# Patient Record
Sex: Female | Born: 1990 | Race: White | Hispanic: No | Marital: Married | Smoking: Never smoker
Health system: Southern US, Community
[De-identification: ages and names within clinical notes are randomized; demographics above are authoritative.]

## PROBLEM LIST (undated history)

## (undated) ENCOUNTER — Inpatient Hospital Stay: Payer: Self-pay

## (undated) DIAGNOSIS — Z23 Encounter for immunization: Secondary | ICD-10-CM

## (undated) DIAGNOSIS — K5900 Constipation, unspecified: Secondary | ICD-10-CM

## (undated) DIAGNOSIS — Z87442 Personal history of urinary calculi: Secondary | ICD-10-CM

## (undated) DIAGNOSIS — F988 Other specified behavioral and emotional disorders with onset usually occurring in childhood and adolescence: Secondary | ICD-10-CM

## (undated) DIAGNOSIS — F909 Attention-deficit hyperactivity disorder, unspecified type: Secondary | ICD-10-CM

## (undated) DIAGNOSIS — L709 Acne, unspecified: Secondary | ICD-10-CM

## (undated) DIAGNOSIS — F419 Anxiety disorder, unspecified: Secondary | ICD-10-CM

## (undated) DIAGNOSIS — O133 Gestational [pregnancy-induced] hypertension without significant proteinuria, third trimester: Secondary | ICD-10-CM

## (undated) HISTORY — PX: WISDOM TOOTH EXTRACTION: SHX21

## (undated) HISTORY — PX: EXCISION OF KELOID: SHX6267

## (undated) HISTORY — DX: Other specified behavioral and emotional disorders with onset usually occurring in childhood and adolescence: F98.8

## (undated) HISTORY — DX: Encounter for immunization: Z23

## (undated) HISTORY — DX: Acne, unspecified: L70.9

## (undated) HISTORY — DX: Attention-deficit hyperactivity disorder, unspecified type: F90.9

## (undated) HISTORY — DX: Constipation, unspecified: K59.00

## (undated) HISTORY — DX: Anxiety disorder, unspecified: F41.9

---
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
## 2012-05-31 ENCOUNTER — Other Ambulatory Visit: Payer: Self-pay | Admitting: Urgent Care

## 2012-05-31 LAB — COMPREHENSIVE METABOLIC PANEL
Albumin: 4.1 g/dL (ref 3.8–5.6)
Anion Gap: 4 — ABNORMAL LOW (ref 7–16)
Chloride: 104 mmol/L (ref 98–107)
EGFR (African American): >60
EGFR (Non-African Amer.): >60
Osmolality: 272 (ref 275–301)
Potassium: 4.1 mmol/L (ref 3.5–5.1)
SGOT(AST): 21 U/L (ref 0–26)
SGPT (ALT): 18 U/L (ref 12–78)
Sodium: 137 mmol/L (ref 136–145)

## 2012-05-31 LAB — CBC WITH DIFFERENTIAL/PLATELET
Basophil %: 0.6 %
Eosinophil #: 0 10*3/uL (ref 0.0–0.7)
Eosinophil %: 0.7 %
HCT: 39.4 % (ref 35.0–47.0)
Lymphocyte %: 42.7 %
MCH: 30.2 pg (ref 26.0–34.0)
MCV: 88 fL (ref 80–100)
Platelet: 264 10*3/uL (ref 150–440)
RDW: 12.5 % (ref 11.5–14.5)
WBC: 4.8 10*3/uL (ref 3.6–11.0)

## 2013-01-13 HISTORY — PX: TONSILLECTOMY: SHX5217

## 2013-03-04 ENCOUNTER — Ambulatory Visit: Payer: Self-pay | Admitting: Unknown Physician Specialty

## 2013-09-05 ENCOUNTER — Other Ambulatory Visit: Payer: Self-pay | Admitting: Urgent Care

## 2013-09-05 LAB — PREGNANCY, URINE: Pregnancy Test, Urine: NEGATIVE m[IU]/mL

## 2014-07-26 ENCOUNTER — Ambulatory Visit: Payer: Self-pay | Admitting: Family Medicine

## 2014-07-27 ENCOUNTER — Ambulatory Visit (INDEPENDENT_AMBULATORY_CARE_PROVIDER_SITE_OTHER): Payer: BLUE CROSS/BLUE SHIELD | Admitting: Family Medicine

## 2014-07-27 ENCOUNTER — Encounter: Payer: Self-pay | Admitting: Family Medicine

## 2014-07-27 VITALS — BP 112/62 | HR 71 | Temp 98.0°F | Resp 16 | Ht 61.0 in | Wt 119.2 lb

## 2014-07-27 DIAGNOSIS — R5383 Other fatigue: Secondary | ICD-10-CM | POA: Diagnosis not present

## 2014-07-27 DIAGNOSIS — F411 Generalized anxiety disorder: Secondary | ICD-10-CM

## 2014-07-27 DIAGNOSIS — Z13220 Encounter for screening for lipoid disorders: Secondary | ICD-10-CM | POA: Insufficient documentation

## 2014-07-27 DIAGNOSIS — K589 Irritable bowel syndrome without diarrhea: Secondary | ICD-10-CM | POA: Diagnosis not present

## 2014-07-27 DIAGNOSIS — Z113 Encounter for screening for infections with a predominantly sexual mode of transmission: Secondary | ICD-10-CM | POA: Diagnosis not present

## 2014-07-27 DIAGNOSIS — K581 Irritable bowel syndrome with constipation: Secondary | ICD-10-CM | POA: Insufficient documentation

## 2014-07-27 MED ORDER — AMITIZA 24 MCG PO CAPS: 24.0000 ug | ORAL_CAPSULE | Freq: Two times a day (BID) | ORAL | Status: DC

## 2014-07-27 NOTE — Progress Notes (Signed)
Name: Dorothy Townsend   MRN: [id number]    DOB: 02-23-1990   Date:07/27/2014       Progress Note  Subjective  Chief Complaint  Chief Complaint  Patient presents with  . Establish Care  . Labs Only    recommended by Dr. Sherlean Foot     HPI  Anxiety: Patient complains of anxiety disorder.  She has the following symptoms: difficulty concentrating, fatigue, feelings of losing control, irritable, palpitations, psychomotor agitation, racing thoughts. Onset of symptoms was approximately several years ago, stable since that time. She denies current suicidal and homicidal ideation. Family history significant for anxiety and depression.Possible organic causes contributing are: none. Risk factors: previous episode of depression Previous treatment includes Prozac and therapy with Dr. Sherlean Foot, psychiatrist in [place]. 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  She complains of the following side effects from the treatment previously: none. She was also taking Adderall for ADD which she does not use at this time. Overall she feels some component of fatigue but is functional and doing well. Wants to get routine blood work to assess her general health and get PAP smear during her CPE.  Constipation: Patient complains of constipation.  Stool pattern has been 3 formed stool(s) per week. Onset was several years ago Defecation has been easy. Co-Morbid conditions:irritable bowel syndrome. Symptoms have been waxing and waning but better overall. Current Health Habits: Eating fiber? yes, amt not measured but generally healthy balanced food choices. Exercise? yes - regular cardio and weights. Water intake? yes, amt not measured but regular intake. Current OTC/RX therapy has been Amitiza which has been somewhat effective.      There are no active problems to display for this patient.   History   Substance Use Topics  . Smoking status: Never Smoker   . Smokeless tobacco: Not on file  . Alcohol Use: 0.0 oz/week    0 Standard drinks or equivalent per week     Comment: seldom     Current outpatient prescriptions:  .  AMITIZA 24 MCG capsule, TK 1 C PO BID PRF CONSTIPATION, Disp: , Rfl: 2  Past Surgical History  Procedure Laterality Date  . Wisdom tooth extraction    . Tonsillectomy    . Excision of keloid      Family History  Problem Relation Age of Onset  . Diabetes Maternal Grandfather     Allergies  Allergen Reactions  . Fluvoxamine Anxiety     Review of Systems  CONSTITUTIONAL: No significant weight changes, fever, chills, weakness.  HEENT:  - Eyes: No visual changes.  - Ears: No auditory changes. No pain.  - Nose: No sneezing, congestion, runny nose. - Throat: No sore throat. No changes in swallowing. SKIN: No rash or itching.  CARDIOVASCULAR: No chest pain, chest pressure or chest discomfort. No palpitations or edema.  RESPIRATORY: No shortness of breath, cough or sputum.  GASTROINTESTINAL: Chronic constipation issues. No anorexia, nausea, vomiting. No abdominal pain or blood.  GENITOURINARY: No dysuria. No frequency. No discharge.  NEUROLOGICAL: No headache, dizziness, syncope, paralysis, ataxia, numbness or tingling in the extremities. No memory changes. No change in bowel or bladder control.  MUSCULOSKELETAL: No joint pain. No muscle pain. HEMATOLOGIC: No anemia, bleeding or bruising.  LYMPHATICS: No enlarged lymph nodes.  PSYCHIATRIC: No change in mood. No change in sleep pattern.  ENDOCRINOLOGIC: No reports of sweating, cold or heat intolerance.  No polyuria or polydipsia.     Objective  BP 112/62 mmHg  Pulse 71  Temp(Src) 98 F (36.7 C) (Oral)  Resp 16  Ht 5\' 1"  (1.549 m)  Wt 119 lb 3.2 oz (54.069 kg)  BMI 22.53 kg/m2  SpO2 98%  LMP 07/10/2014 (Approximate) Body mass index is 22.53 kg/(m^2).  Physical Exam  Constitutional: Patient  appears well-developed and well-nourished. In no distress.  HEENT:  - Head: Normocephalic and atraumatic.  - Ears: Bilateral TMs gray, no erythema or effusion - Nose: Nasal mucosa moist - Mouth/Throat: Oropharynx is clear and moist. No tonsillar hypertrophy or erythema. No post nasal drainage.  - Eyes: Conjunctivae clear, EOM movements normal. PERRLA. No scleral icterus.  Neck: Normal range of motion. Neck supple. No JVD present. No thyromegaly present.  Cardiovascular: Normal rate, regular rhythm and normal heart sounds.  No murmur heard.  Pulmonary/Chest: Effort normal and breath sounds normal. No respiratory distress. Musculoskeletal: Normal range of motion bilateral UE and LE, no joint effusions. Peripheral vascular: Bilateral LE no edema. Neurological: CN II-XII grossly intact with no focal deficits. Alert and oriented to person, place, and time. Coordination, balance, strength, speech and gait are normal.  Skin: Skin is warm and dry. No rash noted. No erythema.  Psychiatric: Patient has a normal mood and affect. Behavior is normal in office today. Judgment and thought content normal in office today.   Assessment & Plan  1. GAD (generalized anxiety disorder) Well controled, discussed benefits of mindfulness, regular exercise, meditation, yoga.  2. Irritable bowel syndrome with constipation Will get blood work, continue Sales executiveAmitiza.  - CBC with Differential/Platelet - Comprehensive metabolic panel - Lipid panel - TSH - AMITIZA 24 MCG capsule; Take 1 capsule (24 mcg total) by mouth 2 (two) times daily with a meal.  Dispense: 60 capsule; Refill: 5  3. Screening cholesterol level   4. Other fatigue Will get blood work. Likely related to mood disorder.  - CBC with Differential/Platelet - Comprehensive metabolic panel - Lipid panel - TSH  5. Screen for STD (sexually transmitted disease) Age appropriate screening.  - HIV antibody

## 2014-07-28 LAB — CBC WITH DIFFERENTIAL/PLATELET
BASOS ABS: 0 10*3/uL (ref 0.0–0.2)
BASOS: 1 %
EOS (ABSOLUTE): 0.1 10*3/uL (ref 0.0–0.4)
Eos: 1 %
HEMOGLOBIN: 13.3 g/dL (ref 11.1–15.9)
Hematocrit: 39.5 % (ref 34.0–46.6)
Immature Grans (Abs): 0 10*3/uL (ref 0.0–0.1)
Immature Granulocytes: 0 %
LYMPHS ABS: 2.1 10*3/uL (ref 0.7–3.1)
Lymphs: 49 %
MCH: 30 pg (ref 26.6–33.0)
MCHC: 33.7 g/dL (ref 31.5–35.7)
MCV: 89 fL (ref 79–97)
MONOCYTES: 5 %
Monocytes Absolute: 0.2 10*3/uL (ref 0.1–0.9)
Neutrophils Absolute: 1.9 10*3/uL (ref 1.4–7.0)
Neutrophils: 44 %
Platelets: 276 10*3/uL (ref 150–379)
RBC: 4.44 x10E6/uL (ref 3.77–5.28)
RDW: 13 % (ref 12.3–15.4)
WBC: 4.4 10*3/uL (ref 3.4–10.8)

## 2014-07-28 LAB — COMPREHENSIVE METABOLIC PANEL
ALT: 12 IU/L (ref 0–32)
AST: 20 IU/L (ref 0–40)
Albumin/Globulin Ratio: 3 — ABNORMAL HIGH (ref 1.1–2.5)
Albumin: 4.8 g/dL (ref 3.5–5.5)
Alkaline Phosphatase: 84 IU/L (ref 39–117)
BUN/Creatinine Ratio: 11 (ref 8–20)
BUN: 7 mg/dL (ref 6–20)
Bilirubin Total: 0.4 mg/dL (ref 0.0–1.2)
CALCIUM: 9.1 mg/dL (ref 8.7–10.2)
CO2: 23 mmol/L (ref 18–29)
CREATININE: 0.63 mg/dL (ref 0.57–1.00)
Chloride: 100 mmol/L (ref 97–108)
GFR calc non Af Amer: 129 mL/min/{1.73_m2} (ref >59)
GFR, EST AFRICAN AMERICAN: 148 mL/min/{1.73_m2} (ref >59)
Globulin, Total: 1.6 g/dL (ref 1.5–4.5)
Glucose: 74 mg/dL (ref 65–99)
Potassium: 4.2 mmol/L (ref 3.5–5.2)
Sodium: 140 mmol/L (ref 134–144)
TOTAL PROTEIN: 6.4 g/dL (ref 6.0–8.5)

## 2014-07-28 LAB — TSH: TSH: 1.83 u[IU]/mL (ref 0.450–4.500)

## 2014-07-28 LAB — LIPID PANEL
CHOL/HDL RATIO: 2.2 ratio (ref 0.0–4.4)
CHOLESTEROL TOTAL: 156 mg/dL (ref 100–199)
HDL: 72 mg/dL (ref >39)
LDL Calculated: 78 mg/dL (ref 0–99)
TRIGLYCERIDES: 32 mg/dL (ref 0–149)
VLDL CHOLESTEROL CAL: 6 mg/dL (ref 5–40)

## 2014-07-28 LAB — HIV ANTIBODY (ROUTINE TESTING W REFLEX): HIV SCREEN 4TH GENERATION: NONREACTIVE

## 2015-11-20 LAB — HM PAP SMEAR: HM PAP: NEGATIVE

## 2016-07-07 ENCOUNTER — Ambulatory Visit: Payer: Self-pay | Admitting: Obstetrics and Gynecology

## 2016-07-23 ENCOUNTER — Ambulatory Visit (INDEPENDENT_AMBULATORY_CARE_PROVIDER_SITE_OTHER): Payer: BLUE CROSS/BLUE SHIELD | Admitting: Obstetrics and Gynecology

## 2016-07-23 ENCOUNTER — Encounter: Payer: Self-pay | Admitting: Obstetrics and Gynecology

## 2016-07-23 VITALS — BP 114/78 | HR 80 | Ht 61.0 in | Wt 120.0 lb

## 2016-07-23 DIAGNOSIS — R102 Pelvic and perineal pain: Secondary | ICD-10-CM

## 2016-07-23 DIAGNOSIS — Z3169 Encounter for other general counseling and advice on procreation: Secondary | ICD-10-CM

## 2016-07-23 NOTE — Progress Notes (Signed)
Chief Complaint  Patient presents with  . preconception  . Pelvic Pain    Lower abdominal pain x 2 weeks/bloated/painful intercourse    HPI:      [name] is a 26 y.o. G0P0000 who LMP was Patient's last menstrual period was 07/05/2016., presents today for an episode of severe pelvic pain about 2 wks ago. Sx started after sex and caused nausea/vomiting/sweating/chills/abd swelling. Pt denies any bleeding. Sx improved after a few hrs but she was sore, it hurt to walk and she didn't feel well for a couple days. Sx resolved fully and pt has had sex again without any sx. This has never happened before. She denies any urin sx, vag sx. She has a hx of constipation but those sx weren't any worse before onset of pain. She has a hx of kidney stones. No hx of ovar cysts. She is trying to conceive and taking PNVs. She is married/no new partners.     Patient Active Problem List   Diagnosis Date Noted  . GAD (generalized anxiety disorder) 07/27/2014  . Irritable bowel syndrome with constipation 07/27/2014  . Screening cholesterol level 07/27/2014  . Other fatigue 07/27/2014    Family History  Problem Relation Age of Onset  . Diabetes Maternal Grandfather   . Cancer Other        UNKNOWN AGE/TYPE-THINKS THROAT    Social History   Social History  . Marital status: Single    Spouse name: N/A  . Number of children: 0  . Years of education: 14   Occupational History  . Stylist     [employer]   Social History Main Topics  . Smoking status: Never Smoker  . Smokeless tobacco: Never Used  . Alcohol use 0.0 oz/week     Comment: seldom  . Drug use: No  . Sexual activity: Yes    Partners: Male    Birth control/ protection: None   Other Topics Concern  . Not on file   Social History Narrative  . No narrative on file    No current outpatient prescriptions on file.  Review of Systems  Constitutional: Negative for fever.  Gastrointestinal: Positive for nausea and  vomiting. Negative for blood in stool, constipation and diarrhea.  Genitourinary: Positive for pelvic pain and vaginal discharge. Negative for dyspareunia, dysuria, flank pain, frequency, hematuria, urgency, vaginal bleeding and vaginal pain.  Musculoskeletal: Negative for back pain.  Skin: Negative for rash.     OBJECTIVE:   Vitals:  BP 114/78 (BP Location: Left Arm, Patient Position: Sitting, Cuff Size: Normal)   Pulse 80   Ht 5\' 1"  (1.549 m)   Wt 120 lb (54.4 kg)   LMP 07/05/2016   BMI 22.67 kg/m   Physical Exam  Constitutional: She is oriented to person, place, and time and well-developed, well-nourished, and in no distress. Vital signs are normal.  Genitourinary: Vagina normal, uterus normal, cervix normal, right adnexa normal, left adnexa normal and vulva normal. Uterus is not enlarged. Cervix exhibits no motion tenderness and no tenderness. Right adnexum displays no mass and no tenderness. Left adnexum displays no mass and no tenderness. Vulva exhibits no erythema, no exudate, no lesion, no rash and no tenderness. Vagina exhibits no lesion.  Neurological: She is oriented to person, place, and time.  Vitals reviewed.    Assessment/Plan: Pelvic pain - Resolved. No sx today. Neg exam. Question etiology. Discussed u/s now or if sx recur. Pt ok to wait. F/u prn.   Pre-conception counseling -  Cont PNVs. F/u prn.    Return if symptoms worsen or fail to improve.  Katheleen Stella B. Ivyrose Hashman, PA-C 07/23/2016 3:22 PM

## 2016-09-22 ENCOUNTER — Encounter: Payer: Self-pay | Admitting: Obstetrics and Gynecology

## 2016-09-22 ENCOUNTER — Ambulatory Visit (INDEPENDENT_AMBULATORY_CARE_PROVIDER_SITE_OTHER): Payer: BLUE CROSS/BLUE SHIELD | Admitting: Obstetrics and Gynecology

## 2016-09-22 VITALS — BP 108/56 | Wt 123.0 lb

## 2016-09-22 DIAGNOSIS — Z3A01 Less than 8 weeks gestation of pregnancy: Secondary | ICD-10-CM

## 2016-09-22 DIAGNOSIS — Z3491 Encounter for supervision of normal pregnancy, unspecified, first trimester: Secondary | ICD-10-CM

## 2016-09-22 DIAGNOSIS — Z3490 Encounter for supervision of normal pregnancy, unspecified, unspecified trimester: Secondary | ICD-10-CM | POA: Insufficient documentation

## 2016-09-22 DIAGNOSIS — N912 Amenorrhea, unspecified: Secondary | ICD-10-CM | POA: Diagnosis not present

## 2016-09-22 LAB — POCT URINE PREGNANCY: Preg Test, Ur: POSITIVE — AB

## 2016-09-22 NOTE — Progress Notes (Signed)
New Obstetric Patient H&P   Chief Complaint: "Desires prenatal care"   History of Present Illness: Patient is a 26 y.o. G1P0000 Not Hispanic or Latino female, LMP 08/03/2016 presents with amenorrhea and positive home pregnancy test. Based on her  LMP, her EDD is Estimated Date of Delivery: 05/10/17 and her EGA is 8463w1d. Cycles are 5. days, regular, and occur approximately every : 28 days. Her last pap smear was about 9 months ago and was no abnormalities.    She had a urine pregnancy test which was positive 3 week(s)  ago. Her last menstrual period was normal and lasted for  5 day(s). Since her LMP she claims she has experienced nausea. She denies vaginal bleeding. Her past medical history is noncontributory. Her prior pregnancies are notable for none  Since her LMP, she admits to the use of tobacco products  no She claims she has gained   zero pounds since the start of her pregnancy.  There are cats in the home in the home  no I  She admits close contact with children on a regular basis  no  She has had chicken pox in the past yes She has had Tuberculosis exposures, symptoms, or previously tested positive for TB   no Current or past history of domestic violence. no  Genetic Screening/Teratology Counseling: (Includes patient, baby's father, or anyone in either family with:)   1. [facility]/= 6735 [facility]  no 2. Thalassemia ([place], [place], Mediterranean, or Asian background): MCV<80  no 3. Neural tube defect (meningomyelocele, spina bifida, anencephaly)  no 4. Congenital heart defect  no  5. Down syndrome  no 6. Tay-Sachs (Jewish, [place])  no 7. Canavan's Disease  no 8. Sickle cell disease or trait (African)  no  9. Hemophilia or other blood disorders  no  10. Muscular dystrophy  no  11. Cystic fibrosis  no  12. Huntington's Chorea  no  13. Mental retardation/autism  no 14. Other inherited genetic or chromosomal disorder  no 15. Maternal metabolic disorder (DM, PKU, etc)   no 16. Patient or FOB with a child with a birth defect not listed above no  16a. Patient or FOB with a birth defect themselves no 17. Recurrent pregnancy loss, or stillbirth  no  18. Any medications since LMP other than prenatal vitamins (include vitamins, supplements, OTC meds, drugs, alcohol)  no 19. Any other genetic/environmental exposure to discuss  no  Infection History:   1. Lives with someone with TB or TB exposed  no  2. Patient or partner has history of genital herpes  no 3. Rash or viral illness since LMP  no 4. History of STI (GC, CT, HPV, syphilis, HIV)  no 5. History of recent travel :  no  Other pertinent information:  no   Review of Systems:10 point review of systems negative unless otherwise noted in HPI  Past Medical History:  Diagnosis Date  . Acne   . ADD (attention deficit disorder)   . ADHD (attention deficit hyperactivity disorder)    history of  . Anxiety    history of  . Constipation   . Immunization, viral disease    GARDASIL COMPLETED    Past Surgical History:  Procedure Laterality Date  . EXCISION OF KELOID    . EXTERNAL EAR SURGERY Bilateral 2012   NODULES REMOVED   . TONSILLECTOMY  2015  . WISDOM TOOTH EXTRACTION      Gynecologic History: Patient's last menstrual period was 08/03/2016 (exact date).  Obstetric History: G1P0000  Family History  Problem Relation Age of Onset  . Diabetes Maternal Grandfather   . Cancer Other        UNKNOWN AGE/TYPE-THINKS THROAT    Social History   Social History  . Marital status: Single    Spouse name: N/A  . Number of children: 0  . Years of education: 14   Occupational History  . Stylist     [employer]   Social History Main Topics  . Smoking status: Never Smoker  . Smokeless tobacco: Never Used  . Alcohol use 0.0 oz/week     Comment: seldom  . Drug use: No  . Sexual activity: Yes    Partners: Male    Birth control/ protection: None   Other Topics Concern  . Not on file    Social History Narrative  . No narrative on file    Allergies  Allergen Reactions  . Fluvoxamine Anxiety    Medications   PNV    Physical Exam BP (!) 108/56   Wt 123 lb (55.8 kg)   LMP 08/03/2016 (Exact Date)   BMI 23.24 kg/m   Physical Exam  Constitutional: She is oriented to person, place, and time. She appears well-developed and well-nourished. No distress.  HENT:  Head: Normocephalic and atraumatic.  Eyes: Conjunctivae are normal.  Neck: Normal range of motion. Neck supple. No thyromegaly present.  Cardiovascular: Normal rate, regular rhythm and normal heart sounds.  Exam reveals no gallop and no friction rub.   No murmur heard. Pulmonary/Chest: Effort normal and breath sounds normal. She has no wheezes.  Abdominal: Soft. She exhibits no distension and no mass. There is no tenderness. There is no rebound and no guarding. No hernia. Hernia confirmed negative in the right inguinal area and confirmed negative in the left inguinal area.  Genitourinary: Vagina normal. Pelvic exam was performed with patient supine. There is no rash, tenderness or lesion on the right labia. There is no rash, tenderness or lesion on the left labia. Uterus is enlarged (8 week size). Uterus is not deviated and not tender. Cervix exhibits no motion tenderness and no discharge. Right adnexum displays no mass, no tenderness and no fullness. Left adnexum displays no mass, no tenderness and no fullness. No tenderness or bleeding in the vagina. No signs of injury around the vagina.  Musculoskeletal: Normal range of motion.  Lymphadenopathy:    She has no cervical adenopathy.       Right: No inguinal adenopathy present.       Left: No inguinal adenopathy present.  Neurological: She is alert and oriented to person, place, and time.  Skin: Skin is warm and dry. No rash noted.  Psychiatric: She has a normal mood and affect. Her behavior is normal. Judgment normal.    Female Chaperone present during breast  and/or pelvic exam.  Assessment: 26 y.o. G1P0000 at [redacted]w[redacted]d presenting to initiate prenatal care  Plan: 1) Avoid alcoholic beverages. 2) Patient encouraged not to smoke.  3) Discontinue the use of all non-medicinal drugs and chemicals.  4) Take prenatal vitamins daily.  5) Nutrition, food safety (fish, cheese advisories, and high nitrite foods) and exercise discussed. 6) Hospital and practice style discussed with cross coverage system.  7) Genetic Screening, such as with 1st Trimester Screening, cell free fetal DNA, AFP testing, and Ultrasound, as well as with amniocentesis and CVS as appropriate, is discussed with patient. At the conclusion of today's visit patient undecided genetic testing 8) Patient is asked about  travel to areas at risk for the Zika virus, and counseled to avoid travel and exposure to mosquitoes or sexual partners who may have themselves been exposed to the virus. Testing is discussed, and will be ordered as appropriate.  9) She does have a history of constipation, which worries her.  Discussed adding a stool softener to her PNV.  Samples given.   [name], MD 09/22/2016 1:45 PM

## 2016-09-24 LAB — GC/CHLAMYDIA PROBE AMP
Chlamydia trachomatis, NAA: NEGATIVE
Neisseria gonorrhoeae by PCR: NEGATIVE

## 2016-09-24 LAB — URINE CULTURE: ORGANISM ID, BACTERIA: NO GROWTH

## 2016-09-29 ENCOUNTER — Ambulatory Visit (INDEPENDENT_AMBULATORY_CARE_PROVIDER_SITE_OTHER): Payer: BLUE CROSS/BLUE SHIELD | Admitting: Obstetrics and Gynecology

## 2016-09-29 ENCOUNTER — Encounter: Payer: BLUE CROSS/BLUE SHIELD | Admitting: Obstetrics and Gynecology

## 2016-09-29 ENCOUNTER — Other Ambulatory Visit: Payer: BLUE CROSS/BLUE SHIELD

## 2016-09-29 ENCOUNTER — Ambulatory Visit (INDEPENDENT_AMBULATORY_CARE_PROVIDER_SITE_OTHER): Payer: BLUE CROSS/BLUE SHIELD

## 2016-09-29 VITALS — BP 110/74 | Wt 125.0 lb

## 2016-09-29 DIAGNOSIS — Z3491 Encounter for supervision of normal pregnancy, unspecified, first trimester: Secondary | ICD-10-CM

## 2016-09-29 DIAGNOSIS — F411 Generalized anxiety disorder: Secondary | ICD-10-CM

## 2016-09-29 DIAGNOSIS — Z362 Encounter for other antenatal screening follow-up: Secondary | ICD-10-CM | POA: Diagnosis not present

## 2016-09-29 DIAGNOSIS — Z3A08 8 weeks gestation of pregnancy: Secondary | ICD-10-CM

## 2016-09-29 NOTE — Progress Notes (Signed)
Routine Prenatal Care Visit  Subjective  Dorothy Townsend is a 26 y.o. G1P0000 at [redacted]w[redacted]d being seen today for ongoing prenatal care.  She is currently monitored for the following issues for this low-risk pregnancy and has GAD (generalized anxiety disorder); Irritable bowel syndrome with constipation; Screening cholesterol level; Other fatigue; and Supervision of low-risk pregnancy on her problem list.  ----------------------------------------------------------------------------------- Patient reports no complaints.    . Vag. Bleeding: None.   . Denies leaking of fluid.  U/S today verified EDD.  Nausea improved with Bonjesta.  Desires 1st trimester screen.  ----------------------------------------------------------------------------------- The following portions of the patient's history were reviewed and updated as appropriate: allergies, current medications, past family history, past medical history, past social history, past surgical history and problem list. Problem list updated.  Objective  Blood pressure 110/74, weight 125 lb (56.7 kg), last menstrual period 08/03/2016. Pregravid weight 118 lb (53.5 kg) Total Weight Gain 7 lb (3.175 kg) Urinalysis: Urine Protein: Negative Urine Glucose: Negative  Fetal Status: Fetal Heart Rate (bpm): 160         General:  Alert, oriented and cooperative. Patient is in no acute distress.  Skin: Skin is warm and dry. No rash noted.   Cardiovascular: Normal heart rate noted  Respiratory: Normal respiratory effort, no problems with respiration noted  Abdomen: Soft, gravid, appropriate for gestational age. Pain/Pressure: Absent     Pelvic:  Cervical exam deferred        Extremities: Normal range of motion.     Mental Status: Normal mood and affect. Normal behavior. Normal judgment and thought content.   Assessment   26 y.o. G1P0000 at [redacted]w[redacted]d by  05/10/2017, by Last Menstrual Period presenting for routine prenatal visit  Plan   pregnancy #1 Problems  (from 08/03/16 to present)    Problem Noted Resolved   Supervision of low-risk pregnancy 09/22/2016 by Conard Novak, MD No   Overview Addendum 09/29/2016  4:08 PM by Conard Novak, MD    [facility] Prenatal Labs  Dating L=8 Blood type:     Genetic Screen 1 Screen:  desires    AFP:   Antibody:   Anatomic [place]  Rubella:   Varicella:    GTT Early:               Third trimester:  RPR:     Rhogam  28 weeks,  pp HBsAg:     TDaP vaccine                        Flu Shot:  offered HIV:     Baby Food                                GBS:   Contraception  Pap:  CBB     CS/VBAC    Support Person             GAD (generalized anxiety disorder) 07/27/2014 by Edwena Felty, MD No   Overview Signed 07/27/2014  9:35 AM by Edwena Felty, MD    Followed by Dr. Sherlean Foot , [facility]. Was previously on Fluoxetine and Adderall but has not needed it recently.        Please refer to After Visit Summary for other counseling recommendations.   Return in about 4 weeks (around 10/27/2016) for schedule u/s for NT and ROB after.  Dorothy Mohair, MD  09/29/2016  4:09 PM

## 2016-09-30 LAB — RPR+RH+ABO+RUB AB+AB SCR+CB...
Antibody Screen: NEGATIVE
HIV Screen 4th Generation wRfx: NONREACTIVE
Hematocrit: 38 % (ref 34.0–46.6)
Hemoglobin: 12.6 g/dL (ref 11.1–15.9)
Hepatitis B Surface Ag: NEGATIVE
MCH: 29.9 pg (ref 26.6–33.0)
MCHC: 33.2 g/dL (ref 31.5–35.7)
MCV: 90 fL (ref 79–97)
Platelets: 260 10*3/uL (ref 150–379)
RBC: 4.22 x10E6/uL (ref 3.77–5.28)
RDW: 12.4 % (ref 12.3–15.4)
RPR Ser Ql: NONREACTIVE
Rh Factor: POSITIVE
Rubella Antibodies, IGG: 10.3 index (ref >0.99)
Varicella zoster IgG: 524 index (ref >165)
WBC: 6.8 10*3/uL (ref 3.4–10.8)

## 2016-09-30 LAB — CYSTIC FIBROSIS MUTATION 97: Interpretation: NOT DETECTED

## 2016-10-30 ENCOUNTER — Ambulatory Visit (INDEPENDENT_AMBULATORY_CARE_PROVIDER_SITE_OTHER): Payer: BLUE CROSS/BLUE SHIELD | Admitting: Obstetrics and Gynecology

## 2016-10-30 ENCOUNTER — Ambulatory Visit (INDEPENDENT_AMBULATORY_CARE_PROVIDER_SITE_OTHER): Payer: BLUE CROSS/BLUE SHIELD

## 2016-10-30 VITALS — BP 114/76 | Wt 127.0 lb

## 2016-10-30 DIAGNOSIS — Z3491 Encounter for supervision of normal pregnancy, unspecified, first trimester: Secondary | ICD-10-CM | POA: Diagnosis not present

## 2016-10-30 DIAGNOSIS — Z362 Encounter for other antenatal screening follow-up: Secondary | ICD-10-CM

## 2016-10-30 DIAGNOSIS — F411 Generalized anxiety disorder: Secondary | ICD-10-CM

## 2016-10-30 DIAGNOSIS — Z3A12 12 weeks gestation of pregnancy: Secondary | ICD-10-CM

## 2016-10-30 NOTE — Patient Instructions (Signed)
First Trimester of Pregnancy The first trimester of pregnancy is from week 1 until the end of week 13 (months 1 through 3). A week after a sperm fertilizes an egg, the egg will implant on the wall of the uterus. This embryo will begin to develop into a baby. Genes from you and your partner will form the baby. The female genes will determine whether the baby will be a boy or a girl. At 6-8 weeks, the eyes and face will be formed, and the heartbeat can be seen on ultrasound. At the end of 12 weeks, all the baby's organs will be formed. Now that you are pregnant, you will want to do everything you can to have a healthy baby. Two of the most important things are to get good prenatal care and to follow your health care provider's instructions. Prenatal care is all the medical care you receive before the baby's birth. This care will help prevent, find, and treat any problems during the pregnancy and childbirth. Body changes during your first trimester Your body goes through many changes during pregnancy. The changes vary from woman to woman.  You may gain or lose a couple of pounds at first.  You may feel sick to your stomach (nauseous) and you may throw up (vomit). If the vomiting is uncontrollable, call your health care provider.  You may tire easily.  You may develop headaches that can be relieved by medicines. All medicines should be approved by your health care provider.  You may urinate more often. Painful urination may mean you have a bladder infection.  You may develop heartburn as a result of your pregnancy.  You may develop constipation because certain hormones are causing the muscles that push stool through your intestines to slow down.  You may develop hemorrhoids or swollen veins (varicose veins).  Your breasts may begin to grow larger and become tender. Your nipples may stick out more, and the tissue that surrounds them (areola) may become darker.  Your gums may bleed and may be  sensitive to brushing and flossing.  Dark spots or blotches (chloasma, mask of pregnancy) may develop on your face. This will likely fade after the baby is born.  Your menstrual periods will stop.  You may have a loss of appetite.  You may develop cravings for certain kinds of food.  You may have changes in your emotions from day to day, such as being excited to be pregnant or being concerned that something may go wrong with the pregnancy and baby.  You may have more vivid and strange dreams.  You may have changes in your hair. These can include thickening of your hair, rapid growth, and changes in texture. Some women also have hair loss during or after pregnancy, or hair that feels dry or thin. Your hair will most likely return to normal after your baby is born.  What to expect at prenatal visits During a routine prenatal visit:  You will be weighed to make sure you and the baby are growing normally.  Your blood pressure will be taken.  Your abdomen will be measured to track your baby's growth.  The fetal heartbeat will be listened to between weeks 10 and 14 of your pregnancy.  Test results from any previous visits will be discussed.  Your health care provider may ask you:  How you are feeling.  If you are feeling the baby move.  If you have had any abnormal symptoms, such as leaking fluid, bleeding, severe headaches,   or abdominal cramping.  If you are using any tobacco products, including cigarettes, chewing tobacco, and electronic cigarettes.  If you have any questions.  Other tests that may be performed during your first trimester include:  Blood tests to find your blood type and to check for the presence of any previous infections. The tests will also be used to check for low iron levels (anemia) and protein on red blood cells (Rh antibodies). Depending on your risk factors, or if you previously had diabetes during pregnancy, you may have tests to check for high blood  sugar that affects pregnant women (gestational diabetes).  Urine tests to check for infections, diabetes, or protein in the urine.  An ultrasound to confirm the proper growth and development of the baby.  Fetal screens for spinal cord problems (spina bifida) and Down syndrome.  HIV (human immunodeficiency virus) testing. Routine prenatal testing includes screening for HIV, unless you choose not to have this test.  You may need other tests to make sure you and the baby are doing well.  Follow these instructions at home: Medicines  Follow your health care provider's instructions regarding medicine use. Specific medicines may be either safe or unsafe to take during pregnancy.  Take a prenatal vitamin that contains at least 600 micrograms (mcg) of folic acid.  If you develop constipation, try taking a stool softener if your health care provider approves. Eating and drinking  Eat a balanced diet that includes fresh fruits and vegetables, whole grains, good sources of protein such as meat, eggs, or tofu, and low-fat dairy. Your health care provider will help you determine the amount of weight gain that is right for you.  Avoid raw meat and uncooked cheese. These carry germs that can cause birth defects in the baby.  Eating four or five small meals rather than three large meals a day may help relieve nausea and vomiting. If you start to feel nauseous, eating a few soda crackers can be helpful. Drinking liquids between meals, instead of during meals, also seems to help ease nausea and vomiting.  Limit foods that are high in fat and processed sugars, such as fried and sweet foods.  To prevent constipation: ? Eat foods that are high in fiber, such as fresh fruits and vegetables, whole grains, and beans. ? Drink enough fluid to keep your urine clear or pale yellow. Activity  Exercise only as directed by your health care provider. Most women can continue their usual exercise routine during  pregnancy. Try to exercise for 30 minutes at least 5 days a week. Exercising will help you: ? Control your weight. ? Stay in shape. ? Be prepared for labor and delivery.  Experiencing pain or cramping in the lower abdomen or lower back is a good sign that you should stop exercising. Check with your health care provider before continuing with normal exercises.  Try to avoid standing for long periods of time. Move your legs often if you must stand in one place for a long time.  Avoid heavy lifting.  Wear low-heeled shoes and practice good posture.  You may continue to have sex unless your health care provider tells you not to. Relieving pain and discomfort  Wear a good support bra to relieve breast tenderness.  Take warm sitz baths to soothe any pain or discomfort caused by hemorrhoids. Use hemorrhoid cream if your health care provider approves.  Rest with your legs elevated if you have leg cramps or low back pain.  If you develop   varicose veins in your legs, wear support hose. Elevate your feet for 15 minutes, 3-4 times a day. Limit salt in your diet. Prenatal care  Schedule your prenatal visits by the twelfth week of pregnancy. They are usually scheduled monthly at first, then more often in the last 2 months before delivery.  Write down your questions. Take them to your prenatal visits.  Keep all your prenatal visits as told by your health care provider. This is important. Safety  Wear your seat belt at all times when driving.  Make a list of emergency phone numbers, including numbers for family, friends, the hospital, and police and fire departments. General instructions  Ask your health care provider for a referral to a local prenatal education class. Begin classes no later than the beginning of month 6 of your pregnancy.  Ask for help if you have counseling or nutritional needs during pregnancy. Your health care provider can offer advice or refer you to specialists for help  with various needs.  Do not use hot tubs, steam rooms, or saunas.  Do not douche or use tampons or scented sanitary pads.  Do not cross your legs for long periods of time.  Avoid cat litter boxes and soil used by cats. These carry germs that can cause birth defects in the baby and possibly loss of the fetus by miscarriage or stillbirth.  Avoid all smoking, herbs, alcohol, and medicines not prescribed by your health care provider. Chemicals in these products affect the formation and growth of the baby.  Do not use any products that contain nicotine or tobacco, such as cigarettes and e-cigarettes. If you need help quitting, ask your health care provider. You may receive counseling support and other resources to help you quit.  Schedule a dentist appointment. At home, brush your teeth with a soft toothbrush and be gentle when you floss. Contact a health care provider if:  You have dizziness.  You have mild pelvic cramps, pelvic pressure, or nagging pain in the abdominal area.  You have persistent nausea, vomiting, or diarrhea.  You have a bad smelling vaginal discharge.  You have pain when you urinate.  You notice increased swelling in your face, hands, legs, or ankles.  You are exposed to fifth disease or chickenpox.  You are exposed to German measles (rubella) and have never had it. Get help right away if:  You have a fever.  You are leaking fluid from your vagina.  You have spotting or bleeding from your vagina.  You have severe abdominal cramping or pain.  You have rapid weight gain or loss.  You vomit blood or material that looks like coffee grounds.  You develop a severe headache.  You have shortness of breath.  You have any kind of trauma, such as from a fall or a car accident. Summary  The first trimester of pregnancy is from week 1 until the end of week 13 (months 1 through 3).  Your body goes through many changes during pregnancy. The changes vary from  woman to woman.  You will have routine prenatal visits. During those visits, your health care provider will examine you, discuss any test results you may have, and talk with you about how you are feeling. This information is not intended to replace advice given to you by your health care provider. Make sure you discuss any questions you have with your health care provider. Document Released: 12/24/2000 Document Revised: 12/12/2015 Document Reviewed: 12/12/2015 Elsevier Interactive Patient Education  2017 [employer]   [employer]  

## 2016-10-30 NOTE — Progress Notes (Signed)
  Routine Prenatal Care Visit  Subjective  Dorothy Townsend is a 26 y.o. G1P0000 at 1769w4d being seen today for ongoing prenatal care.  She is currently monitored for the following issues for this low-risk pregnancy and has GAD (generalized anxiety disorder); Irritable bowel syndrome with constipation; Screening cholesterol level; Other fatigue; and Supervision of low-risk pregnancy on her problem list.  ----------------------------------------------------------------------------------- Patient reports no complaints.    . Vag. Bleeding: None.  Movement: Absent. Denies leaking of fluid.  NT screen today. NT = 2.622mm. Nausea improved.  ----------------------------------------------------------------------------------- The following portions of the patient's history were reviewed and updated as appropriate: allergies, current medications, past family history, past medical history, past social history, past surgical history and problem list. Problem list updated.   Objective  Blood pressure 114/76, weight 127 lb (57.6 kg), last menstrual period 08/03/2016. Pregravid weight 118 lb (53.5 kg) Total Weight Gain 9 lb (4.082 kg) Urinalysis: Urine Protein: Negative Urine Glucose: Negative  Fetal Status: Fetal Heart Rate (bpm): Present   Movement: Absent     General:  Alert, oriented and cooperative. Patient is in no acute distress.  Skin: Skin is warm and dry. No rash noted.   Cardiovascular: Normal heart rate noted  Respiratory: Normal respiratory effort, no problems with respiration noted  Abdomen: Soft, gravid, appropriate for gestational age.       Pelvic:  Cervical exam deferred        Extremities: Normal range of motion.     Mental Status: Normal mood and affect. Normal behavior. Normal judgment and thought content.   Assessment   26 y.o. G1P0000 at 3469w4d by  05/10/2017, by Last Menstrual Period presenting for routine prenatal visit  Plan   pregnancy #1 Problems (from 08/03/16 to present)     Problem Noted Resolved   Supervision of low-risk pregnancy 09/22/2016 by Conard NovakJackson, Ellyce Lafevers D, MD No   Overview Addendum 10/30/2016 10:02 AM by Conard NovakJackson, Carlei Huang D, MD    [facility] Prenatal Labs  Dating L=8 Blood type: B/Positive/-- (09/10 1440)   Genetic Screen 1 Screen: [ ]  desires    AFP:   Antibody:Negative (09/10 1440)  Anatomic US  Rubella: 10.30 (09/10 1440) Varicella:    GTT Early:               Third trimester:  RPR: Non Reactive (09/10 1440)   Rhogam [ ]  28 weeks, [ ]  pp HBsAg: Negative (09/10 1440)   TDaP vaccine                        Flu Shot: [ ]  offered HIV:     Baby Food                                GBS:   Contraception  Pap:  CBB     CS/VBAC    Support Person             GAD (generalized anxiety disorder) 07/27/2014 by Edwena FeltySundaram, Ashany, MD No   Overview Signed 07/27/2014  9:35 AM by Edwena FeltySundaram, Ashany, MD    Followed by Dr. Sherlean FootBrian Wall , [facility]. Was previously on Fluoxetine and Adderall but has not needed it recently.       Please refer to After Visit Summary for other counseling recommendations.   Return in about 4 weeks (around 11/27/2016) for Routine Prenatal Appointment.  Thomasene MohairStephen Damond Borchers, MD  10/30/2016 10:01 AM

## 2016-11-02 LAB — FIRST TRIMESTER SCREEN W/NT
CRL: 56.5 mm
DIA MoM: 1.46
DIA VALUE: 421.2 pg/mL
GEST AGE-COLLECT: 12.1 wk
MATERNAL AGE AT EDD: 24.5 a
NUCHAL TRANSLUCENCY MOM: 1.5
Nuchal Translucency: 2.2 mm
Number of Fetuses: 1
PAPP-A MoM: 0.8
PAPP-A VALUE: 783.6 ng/mL
Test Results:: NEGATIVE
WEIGHT: 128 [lb_av]
hCG MoM: 0.78
hCG Value: 82.3 IU/mL

## 2016-12-01 ENCOUNTER — Ambulatory Visit (INDEPENDENT_AMBULATORY_CARE_PROVIDER_SITE_OTHER): Payer: BLUE CROSS/BLUE SHIELD | Admitting: Obstetrics and Gynecology

## 2016-12-01 ENCOUNTER — Encounter: Payer: Self-pay | Admitting: Obstetrics and Gynecology

## 2016-12-01 VITALS — BP 118/74 | Wt 132.0 lb

## 2016-12-01 DIAGNOSIS — Z3492 Encounter for supervision of normal pregnancy, unspecified, second trimester: Secondary | ICD-10-CM

## 2016-12-01 DIAGNOSIS — Z3A17 17 weeks gestation of pregnancy: Secondary | ICD-10-CM

## 2016-12-01 NOTE — Progress Notes (Signed)
  Routine Prenatal Care Visit  Subjective  Dorothy Townsend is a 26 y.o. G1P0000 at 957w1d being seen today for ongoing prenatal care.  She is currently monitored for the following issues for this low-risk pregnancy and has GAD (generalized anxiety disorder); Irritable bowel syndrome with constipation; Screening cholesterol level; Other fatigue; and Supervision of low-risk pregnancy on their problem list.  ----------------------------------------------------------------------------------- Patient reports no complaints.    . Vag. Bleeding: None.  Movement: Present. Denies leaking of fluid.  Declines msAFP testing ----------------------------------------------------------------------------------- The following portions of the patient's history were reviewed and updated as appropriate: allergies, current medications, past family history, past medical history, past social history, past surgical history and problem list. Problem list updated.   Objective  Blood pressure 118/74, weight 132 lb (59.9 kg), last menstrual period 08/03/2016. Pregravid weight 118 lb (53.5 kg) Total Weight Gain 14 lb (6.35 kg) Urinalysis: Urine Protein: Negative Urine Glucose: Negative  Fetal Status: Fetal Heart Rate (bpm): 145   Movement: Present     General:  Alert, oriented and cooperative. Patient is in no acute distress.  Skin: Skin is warm and dry. No rash noted.   Cardiovascular: Normal heart rate noted  Respiratory: Normal respiratory effort, no problems with respiration noted  Abdomen: Soft, gravid, appropriate for gestational age. Pain/Pressure: Absent     Pelvic:  Cervical exam deferred        Extremities: Normal range of motion.     Mental Status: Normal mood and affect. Normal behavior. Normal judgment and thought content.   Assessment   26 y.o. G1P0000 at [redacted]w[redacted]d by  05/10/2017, by Last Menstrual Period presenting for routine prenatal visit  Plan   pregnancy #1 Problems (from 08/03/16 to present)    Problem Noted Resolved   Supervision of low-risk pregnancy 09/22/2016 by Conard NovakJackson, Dave Mergen D, MD No   Overview Addendum 12/01/2016  9:14 AM by Conard NovakJackson, Esparanza Krider D, MD    [facility] Prenatal Labs  Dating L=8 Blood type: B/Positive/-- (09/10 1440)   Genetic Screen 1 Screen: neg  AFP:  declines Antibody:Negative (09/10 1440)  Anatomic US  Rubella: 10.30 (09/10 1440) Varicella:    GTT Third trimester:  RPR: Non Reactive (09/10 1440)   Rhogam [ ]  28 weeks, [ ]  pp HBsAg: Negative (09/10 1440)   TDaP vaccine                        Flu Shot: [ ]  offered HIV:   Negative  Baby Food                                GBS:   Contraception  Pap:  CBB     CS/VBAC    Support Person             GAD (generalized anxiety disorder) 07/27/2014 by Edwena FeltySundaram, Ashany, MD No   Overview Signed 07/27/2014  9:35 AM by Edwena FeltySundaram, Ashany, MD    Followed by Dr. Sherlean FootBrian Wall , [facility]. Was previously on Fluoxetine and Adderall but has not needed it recently.       Please refer to After Visit Summary for other counseling recommendations.   Return in about 3 weeks (around 12/22/2016) for Schedule anatomy u/s and Routine Prenatal Appointment.  Thomasene MohairStephen Hallelujah Wysong, MD  12/01/2016 9:38 AM

## 2016-12-22 ENCOUNTER — Other Ambulatory Visit: Payer: BLUE CROSS/BLUE SHIELD

## 2016-12-22 ENCOUNTER — Encounter: Payer: BLUE CROSS/BLUE SHIELD | Admitting: Obstetrics and Gynecology

## 2016-12-25 ENCOUNTER — Ambulatory Visit (INDEPENDENT_AMBULATORY_CARE_PROVIDER_SITE_OTHER): Payer: BLUE CROSS/BLUE SHIELD

## 2016-12-25 ENCOUNTER — Encounter: Payer: BLUE CROSS/BLUE SHIELD | Admitting: Advanced Practice Midwife

## 2016-12-25 ENCOUNTER — Ambulatory Visit (INDEPENDENT_AMBULATORY_CARE_PROVIDER_SITE_OTHER): Payer: BLUE CROSS/BLUE SHIELD | Admitting: Obstetrics and Gynecology

## 2016-12-25 ENCOUNTER — Encounter: Payer: Self-pay | Admitting: Obstetrics and Gynecology

## 2016-12-25 VITALS — BP 118/76 | Wt 135.0 lb

## 2016-12-25 DIAGNOSIS — Z3492 Encounter for supervision of normal pregnancy, unspecified, second trimester: Secondary | ICD-10-CM

## 2016-12-25 DIAGNOSIS — Z362 Encounter for other antenatal screening follow-up: Secondary | ICD-10-CM

## 2016-12-25 DIAGNOSIS — Z3A20 20 weeks gestation of pregnancy: Secondary | ICD-10-CM

## 2016-12-25 DIAGNOSIS — K581 Irritable bowel syndrome with constipation: Secondary | ICD-10-CM

## 2016-12-25 DIAGNOSIS — F411 Generalized anxiety disorder: Secondary | ICD-10-CM

## 2016-12-25 NOTE — Progress Notes (Signed)
Routine Prenatal Care Visit Subjective  Dorothy Townsend is a 26 y.o. G1P0000 at 5837w4d being seen today for ongoing prenatal care.  She is currently monitored for the following issues for this low-risk pregnancy and has GAD (generalized anxiety disorder); Irritable bowel syndrome with constipation; Screening cholesterol level; Other fatigue; and Supervision of low-risk pregnancy on their problem list.  ----------------------------------------------------------------------------------- Patient reports no complaints.    . Vag. Bleeding: None.  Movement: Present. Denies leaking of fluid.  Anatomy screen complete today.  ----------------------------------------------------------------------------------- The following portions of the patient's history were reviewed and updated as appropriate: allergies, current medications, past family history, past medical history, past social history, past surgical history and problem list. Problem list updated.   Objective  Blood pressure 118/76, weight 135 lb (61.2 kg), last menstrual period 08/03/2016. Pregravid weight 118 lb (53.5 kg) Total Weight Gain 17 lb (7.711 kg) Urinalysis: Urine Protein: Negative Urine Glucose: Negative  Fetal Status: Fetal Heart Rate (bpm): present   Movement: Present     General:  Alert, oriented and cooperative. Patient is in no acute distress.  Skin: Skin is warm and dry. No rash noted.   Cardiovascular: Normal heart rate noted  Respiratory: Normal respiratory effort, no problems with respiration noted  Abdomen: Soft, gravid, appropriate for gestational age. Pain/Pressure: Absent     Pelvic:  Cervical exam deferred        Extremities: Normal range of motion.     Mental Status: Normal mood and affect. Normal behavior. Normal judgment and thought content.   Assessment   26 y.o. G1P0000 at 1937w4d by  05/10/2017, by Last Menstrual Period presenting for routine prenatal visit  Plan   pregnancy #1 Problems (from 08/03/16 to  present)    Problem Noted Resolved   Supervision of low-risk pregnancy 09/22/2016 by Conard NovakJackson, Neilan Rizzo D, MD No   Overview Addendum 12/01/2016  9:14 AM by Conard NovakJackson, Katianne Barre D, MD    [facility] Prenatal Labs  Dating L=8 Blood type: B/Positive/-- (09/10 1440)   Genetic Screen 1 Screen: neg  AFP:   Antibody:Negative (09/10 1440)  Anatomic US  Rubella: 10.30 (09/10 1440) Varicella:    GTT Third trimester:  RPR: Non Reactive (09/10 1440)   Rhogam [ ]  28 weeks, [ ]  pp HBsAg: Negative (09/10 1440)   TDaP vaccine                        Flu Shot: [ ]  offered HIV:   Negative  Baby Food                                GBS:   Contraception  Pap:  CBB     CS/VBAC    Support Person         GAD (generalized anxiety disorder) 07/27/2014 by Edwena FeltySundaram, Ashany, MD No   Overview Signed 07/27/2014  9:35 AM by Edwena FeltySundaram, Ashany, MD    Followed by Dr. Sherlean FootBrian Wall , [facility]. Was previously on Fluoxetine and Adderall but has not needed it recently.        Preterm labor symptoms and general obstetric precautions including but not limited to vaginal bleeding, contractions, leaking of fluid and fetal movement were reviewed in detail with the patient. Please refer to After Visit Summary for other counseling recommendations.   Return in about 4 weeks (around 01/22/2017) for Routine Prenatal Appointment.  Thomasene MohairStephen Kemet Nijjar, MD  12/25/2016 2:03 PM

## 2017-01-03 ENCOUNTER — Other Ambulatory Visit: Payer: Self-pay

## 2017-01-03 ENCOUNTER — Observation Stay
Admission: EM | Admit: 2017-01-03 | Discharge: 2017-01-04 | Disposition: A | Discharge status: Home / Self Care | Payer: BLUE CROSS/BLUE SHIELD | Attending: Certified Nurse Midwife | Admitting: Certified Nurse Midwife

## 2017-01-03 DIAGNOSIS — Z3A22 22 weeks gestation of pregnancy: Secondary | ICD-10-CM | POA: Diagnosis not present

## 2017-01-03 DIAGNOSIS — N949 Unspecified condition associated with female genital organs and menstrual cycle: Secondary | ICD-10-CM

## 2017-01-03 DIAGNOSIS — O26899 Other specified pregnancy related conditions, unspecified trimester: Secondary | ICD-10-CM | POA: Diagnosis present

## 2017-01-03 DIAGNOSIS — O26892 Other specified pregnancy related conditions, second trimester: Principal | ICD-10-CM | POA: Insufficient documentation

## 2017-01-03 DIAGNOSIS — R109 Unspecified abdominal pain: Secondary | ICD-10-CM

## 2017-01-03 DIAGNOSIS — Z3492 Encounter for supervision of normal pregnancy, unspecified, second trimester: Secondary | ICD-10-CM

## 2017-01-03 DIAGNOSIS — F411 Generalized anxiety disorder: Secondary | ICD-10-CM

## 2017-01-03 HISTORY — DX: Personal history of urinary calculi: Z87.442

## 2017-01-03 NOTE — OB Triage Note (Signed)
Patient arrived in triage with c/o right sided lower abdominal pain "sharp, shooting" that has been constant since this morning.  States baby is moving, but slightly less than normal.  Denies leaking of fluid, vaginal bleeding, or contractions. FHT's obtained by doppler as documented. External toco applied. Abdomen palpated soft. Slightly tender to touch. Explained poc to patient who verbalized understanding.

## 2017-01-04 ENCOUNTER — Encounter: Payer: Self-pay | Admitting: Certified Nurse Midwife

## 2017-01-04 DIAGNOSIS — N949 Unspecified condition associated with female genital organs and menstrual cycle: Secondary | ICD-10-CM

## 2017-01-04 DIAGNOSIS — O26892 Other specified pregnancy related conditions, second trimester: Secondary | ICD-10-CM | POA: Diagnosis not present

## 2017-01-04 DIAGNOSIS — R1031 Right lower quadrant pain: Secondary | ICD-10-CM | POA: Diagnosis not present

## 2017-01-04 DIAGNOSIS — Z3A22 22 weeks gestation of pregnancy: Secondary | ICD-10-CM | POA: Diagnosis not present

## 2017-01-04 LAB — CBC
HCT: 34.6 % — ABNORMAL LOW (ref 35.0–47.0)
Hemoglobin: 11.9 g/dL — ABNORMAL LOW (ref 12.0–16.0)
MCH: 31 pg (ref 26.0–34.0)
MCHC: 34.4 g/dL (ref 32.0–36.0)
MCV: 90.1 fL (ref 80.0–100.0)
PLATELETS: 232 10*3/uL (ref 150–440)
RBC: 3.84 MIL/uL (ref 3.80–5.20)
RDW: 12.1 % (ref 11.5–14.5)
WBC: 7.2 10*3/uL (ref 3.6–11.0)

## 2017-01-04 LAB — URINALYSIS, COMPLETE (UACMP) WITH MICROSCOPIC
Bilirubin Urine: NEGATIVE
GLUCOSE, UA: NEGATIVE mg/dL
Hgb urine dipstick: NEGATIVE
KETONES UR: NEGATIVE mg/dL
Nitrite: NEGATIVE
PH: 6 (ref 5.0–8.0)
Protein, ur: NEGATIVE mg/dL
SPECIFIC GRAVITY, URINE: 1.002 — AB (ref 1.005–1.030)

## 2017-01-04 MED ORDER — ACETAMINOPHEN 500 MG PO TABS: 1000.0000 mg | ORAL_TABLET | Freq: Four times a day (QID) | ORAL | 0 refills | Status: DC | PRN

## 2017-01-04 MED ORDER — CEPHALEXIN 500 MG PO CAPS
500.0000 mg | ORAL_CAPSULE | Freq: Once | ORAL | Status: AC
  Administered 2017-01-04: 500 mg via ORAL
  Filled 2017-01-04: qty 1

## 2017-01-04 MED ORDER — ACETAMINOPHEN 500 MG PO TABS
1000.0000 mg | ORAL_TABLET | Freq: Four times a day (QID) | ORAL | Status: DC | PRN
  Administered 2017-01-04: 1000 mg via ORAL
  Filled 2017-01-04: qty 2

## 2017-01-04 MED ORDER — CEFIXIME 400 MG PO CAPS: 400.0000 mg | ORAL_CAPSULE | Freq: Every day | ORAL | 0 refills | Status: DC

## 2017-01-04 NOTE — Final Progress Note (Addendum)
Physician Final Progress Note  Patient ID: Dorothy Townsend MRN: [id number] DOB/AGE: Aug 18, 1990 26 y.o.  Admit date: 01/03/2017 Admitting provider: Natale Milchhristanna R Schuman, MD Discharge date: 01/04/2017   Admission Diagnoses: Right lower quadrant pain in pregnancy IUP at 22 weeks  Discharge Diagnoses:  Active Problems:   Abdominal pain affecting pregnancy, antepartum   Round ligament pain  Microscopic hematuria Possible UTI History of kidney stones.   Consults: None  Significant Findings/ Diagnostic Studies: 26 year old G1 P0 with EDC=05/10/2017 by LMP presents at 22 weeks with complaints of onset sharp shooting pains in RLQ since getting out of bed this AM. Pain is increased when she tries to move the right leg, stand, or turn over. If she is sitting or lying, the pain stops. She felt better after a warm shower. She took 250 mgm of Tylenol around 1200 without relief. Denies fever, hematuria, vaginal bleeding, dysuria, diarrhea, or vomiting. Had some transient nausea. Denies trauma. Is a hairdresser and stands all day.  [facility] is remarkable for a past medical history of kidney stones, anxiety, and ADD. Current meds [facility] Prenatal Labs  Dating L=8 Blood type: B/Positive/-- (09/10 1440)   Genetic Screen 1 Screen: neg  AFP:  Antibody:Negative (09/10 1440)  Anatomic US WNL with marginal cord insertion, posterior placenta Rubella: 10.30 (09/10 1440) Varicella: Immune  GTT Third trimester:  RPR: Non Reactive (09/10 1440)   Rhogam [ ]  28 weeks, [ ]  pp HBsAg: Negative (09/10 1440)   TDaP vaccine Flu Shot: [ ]  offered HIV: Negative  Baby Food  GBS:   Contraception  Pap:  CBB     CS/VBAC    Support Person               Past Medical History:  Diagnosis Date  . Acne   . ADD (attention deficit disorder)   . ADHD (attention deficit hyperactivity disorder)    history of  . Anxiety    history of  . Constipation   . Immunization, viral  disease    GARDASIL COMPLETED    Past Surgical History:  Procedure Laterality Date  . EXCISION OF KELOID    . EXTERNAL EAR SURGERY Bilateral 2012   NODULES REMOVED   . TONSILLECTOMY  2015  . WISDOM TOOTH EXTRACTION     Social History   Socioeconomic History  . Marital status: Single    Spouse name: Not on file  . Number of children: 0  . Years of education: 514  . Highest education level: Not on file  Social Needs  . Financial resource strain: Not on file  . Food insecurity - worry: Not on file  . Food insecurity - inability: Not on file  . Transportation needs - medical: Not on file  . Transportation needs - non-medical: Not on file  Occupational History  . Occupation: Stylist    Comment: [employer]  Tobacco Use  . Smoking status: Never Smoker  . Smokeless tobacco: Never Used  Substance and Sexual Activity  . Alcohol use: Yes    Alcohol/week: 0.0 oz    Comment: seldom  . Drug use: No  . Sexual activity: Yes    Partners: Male    Birth control/protection: None  Other Topics Concern  . Not on file  Social History Narrative  . Not on file   Family History  Problem Relation Age of Onset  . Diabetes Maternal Grandfather   . Cancer Other        UNKNOWN  AGE/TYPE-THINKS THROAT   Exam: General: appears comfortable lying in bed BP 129/76 (BP Location: Left Arm)   Pulse (!) 102   Temp 98.6 F (37 C) (Oral)   Resp 18   Ht 5\' 1"  (1.549 m)   Wt 61.2 kg (135 lb)   LMP 08/03/2016 (Exact Date)   BMI 25.51 kg/m    Heart: RRR without murmur Lungs: CTAB Abdomen: soft, NT upper abdomen, tenderness along right border of uterus or when displacing uterus to the right (recreates pain), BS active FHT 143 Toco: no uterine activity seen  Results for orders placed or performed during the hospital encounter of 01/03/17 (from the past 24 hour(s))  Urinalysis, Complete w Microscopic     Status: Abnormal   Collection Time: 01/03/17 11:50 PM  Result Value Ref Range   Color,  Urine STRAW (A) YELLOW   APPearance HAZY (A) CLEAR   Specific Gravity, Urine 1.002 (L) 1.005 - 1.030   pH 6.0 5.0 - 8.0   Glucose, UA NEGATIVE NEGATIVE mg/dL   Hgb urine dipstick NEGATIVE NEGATIVE   Bilirubin Urine NEGATIVE NEGATIVE   Ketones, ur NEGATIVE NEGATIVE mg/dL   Protein, ur NEGATIVE NEGATIVE mg/dL   Nitrite NEGATIVE NEGATIVE   Leukocytes, UA SMALL (A) NEGATIVE   RBC / HPF 6-30 0 - 5 RBC/hpf   WBC, UA 0-5 0 - 5 WBC/hpf   Bacteria, UA MANY (A) NONE SEEN   Squamous Epithelial / LPF 0-5 (A) NONE SEEN   Mucus PRESENT   CBC     Status: Abnormal   Collection Time: 01/04/17 12:02 AM  Result Value Ref Range   WBC 7.2 3.6 - 11.0 K/uL   RBC 3.84 3.80 - 5.20 MIL/uL   Hemoglobin 11.9 (L) 12.0 - 16.0 g/dL   HCT 16.134.6 (L) 09.635.0 - 04.547.0 %   MCV 90.1 80.0 - 100.0 fL   MCH 31.0 26.0 - 34.0 pg   MCHC 34.4 32.0 - 36.0 g/dL   RDW 40.912.1 81.111.5 - 91.414.5 %   Platelets 232 150 - 440 K/uL   A: Pain most probably from right round ligament, but also has microscopic hematuria, small leukocytes and many bacteria. R/O UTI.  Less likely to be appy with no fever, normal white count and the fact that the pain resolves with rest. P: Tylenol 1000 mgm q6hrs prn Discussed other measures to relieve pain: maternity support garment, heating pad, positioning Given Keflex 500 mgm prior to discharge and RX for Suprax sent to pharmacy Will call with urine culture results Discharge to home  Procedures: none  Discharge Condition: stable  Disposition: home Diet: Regular diet  Discharge Activity: No heavy lifting or strenuous activity   Allergies as of 01/04/2017      Reactions   Fluvoxamine Anxiety      Medication List    TAKE these medications   acetaminophen 500 MG tablet Commonly known as:  TYLENOL Take 2 tablets (1,000 mg total) by mouth every 6 (six) hours as needed for fever or headache.   Cefixime 400 MG Caps capsule Commonly known as:  SUPRAX Take 1 capsule (400 mg total) by mouth daily.    PRENATAL VITAMIN PO Take by mouth.        Total time spent taking care of this patient: 20 minutes  Signed: Farrel Connersolleen Gutierrez 01/04/2017, 12:45 AM   I agree with the assessment and plan of this patient.  Adelene Idlerhristanna Schuman MD 01/15/17

## 2017-01-04 NOTE — Discharge Instructions (Signed)
Round Ligament Pain The round ligament is a cord of muscle and tissue that helps to support the uterus. It can become a source of pain during pregnancy if it becomes stretched or twisted as the baby grows. The pain usually begins in the second trimester of pregnancy, and it can come and go until the baby is delivered. It is not a serious problem, and it does not cause harm to the baby. Round ligament pain is usually a short, sharp, and pinching pain, but it can also be a dull, lingering, and aching pain. The pain is felt in the lower side of the abdomen or in the groin. It usually starts deep in the groin and moves up to the outside of the hip area. Pain can occur with:  A sudden change in position.  Rolling over in bed.  Coughing or sneezing.  Physical activity.  Follow these instructions at home: Watch your condition for any changes. Take these steps to help with your pain:  When the pain starts, relax. Then try: ? Sitting down. ? Flexing your knees up to your abdomen. ? Lying on your side with one pillow under your abdomen and another pillow between your legs. ? Sitting in a warm bath for 15-20 minutes or until the pain goes away.  Take over-the-counter and prescription medicines only as told by your health care provider.  Move slowly when you sit and stand.  Avoid long walks if they cause pain.  Stop or lessen your physical activities if they cause pain.  Maternity support garments may help. You can purchase them online  Contact a health care provider if:  Your pain does not go away with treatment.  You feel pain in your back that you did not have before.  Your medicine is not helping. Get help right away if:  You develop a fever or chills.  You develop uterine contractions.  You develop vaginal bleeding.  You develop nausea or vomiting.  You develop diarrhea.  You have pain when you urinate. This information is not intended to replace advice given to you by  your health care provider. Make sure you discuss any questions you have with your health care provider. Document Released: 10/09/2007 Document Revised: 06/07/2015 Document Reviewed: 03/08/2014 Elsevier Interactive Patient Education  [employer]2018 Elsevier Inc.

## 2017-01-04 NOTE — OB Triage Note (Signed)
Patient given discharge instructions and verbalized understanding.

## 2017-01-05 ENCOUNTER — Telehealth: Payer: Self-pay

## 2017-01-05 ENCOUNTER — Encounter: Payer: Self-pay | Admitting: Obstetrics and Gynecology

## 2017-01-05 LAB — URINE CULTURE
CULTURE: NO GROWTH
Special Requests: NORMAL

## 2017-01-05 NOTE — Telephone Encounter (Signed)
Just wanted to give SDJ an FYI that she had some severe rt lower abdominal pain on Saturday. She couldn't raise her right leg. She went to L&D. CLG did labs & urine & treated her w/Suprax.

## 2017-01-05 NOTE — Telephone Encounter (Signed)
Pt states this message is for SDJ. "Saturday" messaged ended abruptly. Will contact pt.

## 2017-01-05 NOTE — Telephone Encounter (Signed)
Patient feeling better today with only mild pain. Instructed to d/c abx as u culture was negative.

## 2017-01-13 NOTE — L&D Delivery Note (Signed)
Delivery Note At 12:20 PM a viable female was delivered via Vaginal, Spontaneous (Presentation: ROA).  APGAR: 8, 9; weight pending.   Placenta status: delivered, spontaneously.  Cord: 3VC with the following complications: none.  Cord pH: n/a  Anesthesia:  epidural Episiotomy: None Lacerations: 1st degree;Vaginal Suture Repair: 3.0 vicryl Est. Blood Loss (mL): 350  Mom to postpartum.  Baby to Couplet care / Skin to Skin.  Called to see patient.  Mom pushed to deliver a viable female infant.  The head followed by shoulders, which delivered without difficulty, and the rest of the body.  No nuchal cord noted.  Baby to mom's chest.  Cord clamped and cut after > 1 min delay.  No cord blood obtained.  Placenta delivered spontaneously, intact, with a 3-vessel cord.  First degree vaginal laceration repaired with 3-0 Vicryl with a single, figure-of-eight stitch for hemostasis.  All counts correct.  Hemostasis obtained with IV pitocin and fundal massage. EBL 350 mL.     Thomasene MohairStephen Treston Coker, MD 04/14/2017, 12:43 PM

## 2017-01-22 ENCOUNTER — Telehealth: Payer: Self-pay | Admitting: Obstetrics and Gynecology

## 2017-01-22 ENCOUNTER — Ambulatory Visit (INDEPENDENT_AMBULATORY_CARE_PROVIDER_SITE_OTHER): Payer: BLUE CROSS/BLUE SHIELD | Admitting: Obstetrics and Gynecology

## 2017-01-22 ENCOUNTER — Encounter: Payer: BLUE CROSS/BLUE SHIELD | Admitting: Obstetrics and Gynecology

## 2017-01-22 ENCOUNTER — Encounter: Payer: Self-pay | Admitting: Obstetrics and Gynecology

## 2017-01-22 VITALS — BP 116/74 | Wt 142.0 lb

## 2017-01-22 DIAGNOSIS — Z3492 Encounter for supervision of normal pregnancy, unspecified, second trimester: Secondary | ICD-10-CM

## 2017-01-22 DIAGNOSIS — Z131 Encounter for screening for diabetes mellitus: Secondary | ICD-10-CM

## 2017-01-22 DIAGNOSIS — Z113 Encounter for screening for infections with a predominantly sexual mode of transmission: Secondary | ICD-10-CM

## 2017-01-22 DIAGNOSIS — Z3A24 24 weeks gestation of pregnancy: Secondary | ICD-10-CM

## 2017-01-22 DIAGNOSIS — F411 Generalized anxiety disorder: Secondary | ICD-10-CM

## 2017-01-22 LAB — POCT WET PREP WITH KOH
Clue Cells Wet Prep HPF POC: NEGATIVE
PH, VAGINAL: 4.5
TRICHOMONAS UA: NEGATIVE
Yeast Wet Prep HPF POC: NEGATIVE

## 2017-01-22 NOTE — Patient Instructions (Signed)
Second Trimester of Pregnancy The second trimester is from week 13 through week 28, month 4 through 6. This is often the time in pregnancy that you feel your best. Often times, morning sickness has lessened or quit. You may have more energy, and you may get hungry more often. Your unborn baby (fetus) is growing rapidly. At the end of the sixth month, he or she is about 9 inches long and weighs about 1 pounds. You will likely feel the baby move (quickening) between 18 and 20 weeks of pregnancy. Follow these instructions at home:  Avoid all smoking, herbs, and alcohol. Avoid drugs not approved by your doctor.  Do not use any tobacco products, including cigarettes, chewing tobacco, and electronic cigarettes. If you need help quitting, ask your doctor. You may get counseling or other support to help you quit.  Only take medicine as told by your doctor. Some medicines are safe and some are not during pregnancy.  Exercise only as told by your doctor. Stop exercising if you start having cramps.  Eat regular, healthy meals.  Wear a good support bra if your breasts are tender.  Do not use hot tubs, steam rooms, or saunas.  Wear your seat belt when driving.  Avoid raw meat, uncooked cheese, and liter boxes and soil used by cats.  Take your prenatal vitamins.  Take 1500-2000 milligrams of calcium daily starting at the 20th week of pregnancy until you deliver your baby.  Try taking medicine that helps you poop (stool softener) as needed, and if your doctor approves. Eat more fiber by eating fresh fruit, vegetables, and whole grains. Drink enough fluids to keep your pee (urine) clear or pale yellow.  Take warm water baths (sitz baths) to soothe pain or discomfort caused by hemorrhoids. Use hemorrhoid cream if your doctor approves.  If you have puffy, bulging veins (varicose veins), wear support hose. Raise (elevate) your feet for 15 minutes, 3-4 times a day. Limit salt in your diet.  Avoid heavy  lifting, wear low heals, and sit up straight.  Rest with your legs raised if you have leg cramps or low back pain.  Visit your dentist if you have not gone during your pregnancy. Use a soft toothbrush to brush your teeth. Be gentle when you floss.  You can have sex (intercourse) unless your doctor tells you not to.  Go to your doctor visits. Get help if:  You feel dizzy.  You have mild cramps or pressure in your lower belly (abdomen).  You have a nagging pain in your belly area.  You continue to feel sick to your stomach (nauseous), throw up (vomit), or have watery poop (diarrhea).  You have bad smelling fluid coming from your vagina.  You have pain with peeing (urination). Get help right away if:  You have a fever.  You are leaking fluid from your vagina.  You have spotting or bleeding from your vagina.  You have severe belly cramping or pain.  You lose or gain weight rapidly.  You have trouble catching your breath and have chest pain.  You notice sudden or extreme puffiness (swelling) of your face, hands, ankles, feet, or legs.  You have not felt the baby move in over an hour.  You have severe headaches that do not go away with medicine.  You have vision changes. This information is not intended to replace advice given to you by your health care provider. Make sure you discuss any questions you have with your health care   provider. Document Released: 03/26/2009 Document Revised: 06/07/2015 Document Reviewed: 03/02/2012 Elsevier Interactive Patient Education  2017 Elsevier Inc.  

## 2017-01-22 NOTE — Progress Notes (Signed)
Routine Prenatal Care Visit  Subjective  Dorothy Townsend is a 27 y.o. G1P0000 at 725w4d being seen today for ongoing prenatal care.  She is currently monitored for the following issues for this low-risk pregnancy and has GAD (generalized anxiety disorder); Irritable bowel syndrome with constipation; Screening cholesterol level; Other fatigue; Supervision of low-risk pregnancy; Abdominal pain affecting pregnancy, antepartum; and Round ligament pain on their problem list.  ----------------------------------------------------------------------------------- Patient reports vaginal pressure and clear discharge, no itching, burning, irritation.    . Vag. Bleeding: None.  Movement: Present. Denies leaking of fluid.  ----------------------------------------------------------------------------------- The following portions of the patient's history were reviewed and updated as appropriate: allergies, current medications, past family history, past medical history, past social history, past surgical history and problem list. Problem list updated.   Objective  Blood pressure 116/74, weight 142 lb (64.4 kg), last menstrual period 08/03/2016. Pregravid weight 118 lb (53.5 kg) Total Weight Gain 24 lb (10.9 kg) Urinalysis: Urine Protein: Negative Urine Glucose: Negative  Fetal Status: Fetal Heart Rate (bpm): 125 Fundal Height: 24 cm Movement: Present     General:  Alert, oriented and cooperative. Patient is in no acute distress.  Skin: Skin is warm and dry. No rash noted.   Cardiovascular: Normal heart rate noted  Respiratory: Normal respiratory effort, no problems with respiration noted  Abdomen: Soft, gravid, appropriate for gestational age. Pain/Pressure: Absent     Pelvic:  Cervical exam deferred       NEFG, vaginal pink and normally rugatted, cervix appears closed  Extremities: Normal range of motion.     Mental Status: Normal mood and affect. Normal behavior. Normal judgment and thought content.    Wet Prep: PH: 4.5 Clue Cells: Negative Fungal elements: Negative Trichomonas: Negative  Female chaperone present for pelvic exam:   Assessment   27 y.o. G1P0000 at 7225w4d by  05/10/2017, by Last Menstrual Period presenting for routine prenatal visit  Plan   pregnancy #1 Problems (from 08/03/16 to present)    Problem Noted Resolved   Supervision of low-risk pregnancy 09/22/2016 by Conard NovakJackson, Jermayne Sweeney D, MD No   Overview Addendum 12/01/2016  9:14 AM by Conard NovakJackson, Latisa Belay D, MD    [facility] Prenatal Labs  Dating L=8 Blood type: B/Positive/-- (09/10 1440)   Genetic Screen 1 Screen: neg  AFP:   Antibody:Negative (09/10 1440)  Anatomic US  Rubella: 10.30 (09/10 1440) Varicella:    GTT Third trimester:  RPR: Non Reactive (09/10 1440)   Rhogam [ ]  28 weeks, [ ]  pp HBsAg: Negative (09/10 1440)   TDaP vaccine                        Flu Shot: [ ]  offered HIV:   Negative  Baby Food                                GBS:   Contraception  Pap:  CBB     CS/VBAC    Support Person             GAD (generalized anxiety disorder) 07/27/2014 by Edwena FeltySundaram, Ashany, MD No   Overview Signed 07/27/2014  9:35 AM by Edwena FeltySundaram, Ashany, MD    Followed by Dr. Sherlean FootBrian Wall , [facility]. Was previously on Fluoxetine and Adderall but has not needed it recently.        Preterm labor symptoms and general obstetric precautions including but not limited to vaginal  bleeding, contractions, leaking of fluid and fetal movement were reviewed in detail with the patient. Please refer to After Visit Summary for other counseling recommendations.   Return in about 3 weeks (around 02/12/2017) for schedule 28 wk labs and routine prenatal with Dr Jean Rosenthal,  may make another appt 2 wks later w SDJ.  [name], MD  01/22/2017 11:31 AM

## 2017-01-22 NOTE — Telephone Encounter (Signed)
I spoke with the patient to let her know that [facility] is out-of-network for her plan ([employer] 2500 with [facility]) according to the [employer] representative Janie Morning(Christine S, ref# [id number]). Patient will talk with her husband and check with their insurance agent for options. Ext given.

## 2017-01-23 NOTE — Telephone Encounter (Signed)
Patient called to say she spoke w/ Joni ReiningNicole @ [employer] yesterday (ref# [id number])and was told that we were in-network, that they checked [facility], [facility], and Dr. Jean RosenthalJackson, that they have been receiving a lot of questions about this plan, and that it's not just [facility] providers that are in-network, and that it's the [facility] plan, that [facility] is just part of the name.

## 2017-01-30 NOTE — Telephone Encounter (Signed)
I spoke to Kerr-McGeeate @ [employer] [phone](ref#[id number]) and he checked the NPI #s for both [facility] and Dr. Tiburcio PeaHarris, and they were both in-network for the patient's plan. Lmtrc.

## 2017-01-30 NOTE — Telephone Encounter (Signed)
Patient returned the call and I let her know that per Nate @ [employer], both [facility] and Dr. Tiburcio PeaHarris were in-network for her plan.

## 2017-02-16 ENCOUNTER — Ambulatory Visit (INDEPENDENT_AMBULATORY_CARE_PROVIDER_SITE_OTHER): Payer: BLUE CROSS/BLUE SHIELD | Admitting: Obstetrics and Gynecology

## 2017-02-16 ENCOUNTER — Encounter: Payer: Self-pay | Admitting: Obstetrics and Gynecology

## 2017-02-16 ENCOUNTER — Other Ambulatory Visit: Payer: BLUE CROSS/BLUE SHIELD

## 2017-02-16 VITALS — BP 118/76 | Wt 150.0 lb

## 2017-02-16 DIAGNOSIS — Z113 Encounter for screening for infections with a predominantly sexual mode of transmission: Secondary | ICD-10-CM

## 2017-02-16 DIAGNOSIS — Z131 Encounter for screening for diabetes mellitus: Secondary | ICD-10-CM

## 2017-02-16 DIAGNOSIS — F411 Generalized anxiety disorder: Secondary | ICD-10-CM

## 2017-02-16 DIAGNOSIS — Z3492 Encounter for supervision of normal pregnancy, unspecified, second trimester: Secondary | ICD-10-CM

## 2017-02-16 DIAGNOSIS — Z3A28 28 weeks gestation of pregnancy: Secondary | ICD-10-CM

## 2017-02-16 DIAGNOSIS — Z3493 Encounter for supervision of normal pregnancy, unspecified, third trimester: Secondary | ICD-10-CM

## 2017-02-16 NOTE — Progress Notes (Signed)
Routine Prenatal Care Visit  Subjective  Dorothy Townsend is a 27 y.o. G1P0000 at [redacted]w[redacted]d being seen today for ongoing prenatal care.  She is currently monitored for the following issues for this low-risk pregnancy and has GAD (generalized anxiety disorder); Irritable bowel syndrome with constipation; Screening cholesterol level; Other fatigue; Supervision of low-risk pregnancy; Abdominal pain affecting pregnancy, antepartum; and Round ligament pain on their problem list.  ----------------------------------------------------------------------------------- Patient reports no complaints.    . Vag. Bleeding: None.  Movement: Present. Denies leaking of fluid.  ----------------------------------------------------------------------------------- The following portions of the patient's history were reviewed and updated as appropriate: allergies, current medications, past family history, past medical history, past social history, past surgical history and problem list. Problem list updated.   Objective  Blood pressure 118/76, weight 150 lb (68 kg), last menstrual period 08/03/2016. Pregravid weight 118 lb (53.5 kg) Total Weight Gain 32 lb (14.5 kg) Urinalysis: Urine Protein: Negative Urine Glucose: Negative  Fetal Status: Fetal Heart Rate (bpm): 140 Fundal Height: 29 cm Movement: Present     General:  Alert, oriented and cooperative. Patient is in no acute distress.  Skin: Skin is warm and dry. No rash noted.   Cardiovascular: Normal heart rate noted  Respiratory: Normal respiratory effort, no problems with respiration noted  Abdomen: Soft, gravid, appropriate for gestational age. Pain/Pressure: Absent     Pelvic:  Cervical exam deferred        Extremities: Normal range of motion.     Mental Status: Normal mood and affect. Normal behavior. Normal judgment and thought content.   Assessment   27 y.o. G1P0000 at [redacted]w[redacted]d by  05/10/2017, by Last Menstrual Period presenting for routine prenatal  visit  Plan   pregnancy #1 Problems (from 08/03/16 to present)    Problem Noted Resolved   Supervision of low-risk pregnancy 09/22/2016 by Conard Novak, MD No   Overview Addendum 12/01/2016  9:14 AM by Conard Novak, MD    [facility] Prenatal Labs  Dating L=8 Blood type: B/Positive/-- (09/10 1440)   Genetic Screen 1 Screen: neg  AFP:   Antibody:Negative (09/10 1440)  Anatomic [place]  Rubella: 10.30 (09/10 1440) Varicella:    GTT Third trimester:  RPR: Non Reactive (09/10 1440)   Rhogam [ ]  28 weeks, [ ]  pp HBsAg: Negative (09/10 1440)   TDaP vaccine                        Flu Shot: [ ]  offered HIV:   Negative  Baby Food                                GBS:   Contraception  Pap:  CBB     CS/VBAC    Support Person               GAD (generalized anxiety disorder) 07/27/2014 by Edwena Felty, MD No   Overview Signed 07/27/2014  9:35 AM by Edwena Felty, MD    Followed by Dr. Sherlean Foot , [facility]. Was previously on Fluoxetine and Adderall but has not needed it recently.          Preterm labor symptoms and general obstetric precautions including but not limited to vaginal bleeding, contractions, leaking of fluid and fetal movement were reviewed in detail with the patient. Please refer to After Visit Summary for other counseling recommendations.   -28 wk labs today, rh+  Return in about 2 weeks (around 03/02/2017) for Routine Prenatal Appointment (may make several 2-week ROB appts).  Thomasene MohairStephen Jackson, MD  02/16/2017 10:32 AM

## 2017-02-16 NOTE — Patient Instructions (Signed)

## 2017-02-17 LAB — 28 WEEK RH+PANEL
BASOS ABS: 0 10*3/uL (ref 0.0–0.2)
BASOS: 0 %
EOS (ABSOLUTE): 0 10*3/uL (ref 0.0–0.4)
Eos: 1 %
GESTATIONAL DIABETES SCREEN: 123 mg/dL (ref 65–139)
HIV Screen 4th Generation wRfx: NONREACTIVE
Hematocrit: 37.6 % (ref 34.0–46.6)
Hemoglobin: 12.1 g/dL (ref 11.1–15.9)
IMMATURE GRANULOCYTES: 0 %
Immature Grans (Abs): 0 10*3/uL (ref 0.0–0.1)
LYMPHS: 18 %
Lymphocytes Absolute: 1.5 10*3/uL (ref 0.7–3.1)
MCH: 29.8 pg (ref 26.6–33.0)
MCHC: 32.2 g/dL (ref 31.5–35.7)
MCV: 93 fL (ref 79–97)
MONOS ABS: 0.4 10*3/uL (ref 0.1–0.9)
Monocytes: 4 %
NEUTROS PCT: 77 %
Neutrophils Absolute: 6.5 10*3/uL (ref 1.4–7.0)
PLATELETS: 245 10*3/uL (ref 150–379)
RBC: 4.06 x10E6/uL (ref 3.77–5.28)
RDW: 13.1 % (ref 12.3–15.4)
RPR: NONREACTIVE
WBC: 8.4 10*3/uL (ref 3.4–10.8)

## 2017-03-02 ENCOUNTER — Encounter: Payer: Self-pay | Admitting: Obstetrics and Gynecology

## 2017-03-02 ENCOUNTER — Ambulatory Visit (INDEPENDENT_AMBULATORY_CARE_PROVIDER_SITE_OTHER): Payer: BLUE CROSS/BLUE SHIELD | Admitting: Obstetrics and Gynecology

## 2017-03-02 VITALS — BP 116/80 | Wt 152.0 lb

## 2017-03-02 DIAGNOSIS — Z23 Encounter for immunization: Secondary | ICD-10-CM | POA: Diagnosis not present

## 2017-03-02 DIAGNOSIS — F411 Generalized anxiety disorder: Secondary | ICD-10-CM

## 2017-03-02 DIAGNOSIS — Z3A30 30 weeks gestation of pregnancy: Secondary | ICD-10-CM

## 2017-03-02 DIAGNOSIS — Z3493 Encounter for supervision of normal pregnancy, unspecified, third trimester: Secondary | ICD-10-CM

## 2017-03-02 NOTE — Addendum Note (Signed)
Addended by: Liliane ShiSHAW, BEVERLY M on: 03/02/2017 10:05 AM   Modules accepted: Orders

## 2017-03-02 NOTE — Progress Notes (Signed)
Routine Prenatal Care Visit  Subjective  Dorothy Townsend is a 27 y.o. G1P0000 at [redacted]w[redacted]d being seen today for ongoing prenatal care.  She is currently monitored for the following issues for this low-risk pregnancy and has GAD (generalized anxiety disorder); Irritable bowel syndrome with constipation; Screening cholesterol level; Other fatigue; Supervision of low-risk pregnancy; Abdominal pain affecting pregnancy, antepartum; and Round ligament pain on their problem list.  ----------------------------------------------------------------------------------- Patient reports no complaints.   Contractions: Not present. Vag. Bleeding: None.  Movement: Present. Denies leaking of fluid.  ----------------------------------------------------------------------------------- The following portions of the patient's history were reviewed and updated as appropriate: allergies, current medications, past family history, past medical history, past social history, past surgical history and problem list. Problem list updated.   Objective  Blood pressure 116/80, weight 152 lb (68.9 kg), last menstrual period 08/03/2016. Pregravid weight 118 lb (53.5 kg) Total Weight Gain 34 lb (15.4 kg) Urinalysis: Urine Protein: Negative Urine Glucose: Negative  Fetal Status: Fetal Heart Rate (bpm): 150 Fundal Height: 30 cm Movement: Present     General:  Alert, oriented and cooperative. Patient is in no acute distress.  Skin: Skin is warm and dry. No rash noted.   Cardiovascular: Normal heart rate noted  Respiratory: Normal respiratory effort, no problems with respiration noted  Abdomen: Soft, gravid, appropriate for gestational age. Pain/Pressure: Present     Pelvic:  Cervical exam deferred        Extremities: Normal range of motion.     Mental Status: Normal mood and affect. Normal behavior. Normal judgment and thought content.   Assessment   27 y.o. G1P0000 at [redacted]w[redacted]d by  05/10/2017, by Last Menstrual Period presenting  for routine prenatal visit  Plan   pregnancy #1 Problems (from 08/03/16 to present)    Problem Noted Resolved   Supervision of low-risk pregnancy 09/22/2016 by Conard Novak, MD No   Overview Addendum 03/02/2017  9:23 AM by Conard Novak, MD    [facility] Prenatal Labs  Dating L=8 Blood type: B/Positive/-- (09/10 1440)   Genetic Screen 1 Screen: neg  AFP:   Antibody:Negative (09/10 1440)  Anatomic [place] complete Rubella: 10.30 (09/10 1440) Varicella:    GTT Third trimester: 125 RPR: Non Reactive (09/10 1440)   Rhogam n/a HBsAg: Negative (09/10 1440)   TDaP vaccine                        Flu Shot: [ ]  offered HIV:   Negative  Baby Food                                GBS:   Contraception  Pap:  CBB     CS/VBAC    Support Person               GAD (generalized anxiety disorder) 07/27/2014 by Edwena Felty, MD No   Overview Signed 07/27/2014  9:35 AM by Edwena Felty, MD    Followed by Dr. Sherlean Foot , [facility]. Was previously on Fluoxetine and Adderall but has not needed it recently.          Preterm labor symptoms and general obstetric precautions including but not limited to vaginal bleeding, contractions, leaking of fluid and fetal movement were reviewed in detail with the patient. Please refer to After Visit Summary for other counseling recommendations.   TDaP today  Return in about 2 weeks (around 03/16/2017) for  Routine Prenatal Appointment.  Thomasene MohairStephen Melida Northington, MD  03/02/2017 9:56 AM

## 2017-03-16 ENCOUNTER — Encounter: Payer: Self-pay | Admitting: Obstetrics and Gynecology

## 2017-03-16 ENCOUNTER — Ambulatory Visit (INDEPENDENT_AMBULATORY_CARE_PROVIDER_SITE_OTHER): Payer: BLUE CROSS/BLUE SHIELD | Admitting: Obstetrics and Gynecology

## 2017-03-16 VITALS — BP 120/74 | Wt 154.0 lb

## 2017-03-16 DIAGNOSIS — Z3A32 32 weeks gestation of pregnancy: Secondary | ICD-10-CM

## 2017-03-16 DIAGNOSIS — F411 Generalized anxiety disorder: Secondary | ICD-10-CM

## 2017-03-16 DIAGNOSIS — Z3493 Encounter for supervision of normal pregnancy, unspecified, third trimester: Secondary | ICD-10-CM

## 2017-03-16 DIAGNOSIS — K581 Irritable bowel syndrome with constipation: Secondary | ICD-10-CM

## 2017-03-16 NOTE — Progress Notes (Signed)
Routine Prenatal Care Visit  Subjective  Dorothy Townsend is a 27 y.o. G1P0000 at [redacted]w[redacted]d being seen today for ongoing prenatal care.  She is currently monitored for the following issues for this low-risk pregnancy and has GAD (generalized anxiety disorder); Irritable bowel syndrome with constipation; Screening cholesterol level; Other fatigue; Supervision of low-risk pregnancy; Abdominal pain affecting pregnancy, antepartum; and Round ligament pain on their problem list.  ----------------------------------------------------------------------------------- Patient reports no complaints.   Contractions: Irritability. Vag. Bleeding: None.  Movement: Present. Denies leaking of fluid.  ----------------------------------------------------------------------------------- The following portions of the patient's history were reviewed and updated as appropriate: allergies, current medications, past family history, past medical history, past social history, past surgical history and problem list. Problem list updated.   Objective  Blood pressure 120/74, weight 154 lb (69.9 kg), last menstrual period 08/03/2016. Pregravid weight 118 lb (53.5 kg) Total Weight Gain 36 lb (16.3 kg) Urinalysis: Urine Protein: Negative Urine Glucose: Negative  Fetal Status: Fetal Heart Rate (bpm): 150 Fundal Height: 32 cm Movement: Present  Presentation: Vertex  General:  Alert, oriented and cooperative. Patient is in no acute distress.  Skin: Skin is warm and dry. No rash noted.   Cardiovascular: Normal heart rate noted  Respiratory: Normal respiratory effort, no problems with respiration noted  Abdomen: Soft, gravid, appropriate for gestational age. Pain/Pressure: Present     Pelvic:  Cervical exam deferred        Extremities: Normal range of motion.     Mental Status: Normal mood and affect. Normal behavior. Normal judgment and thought content.   Assessment   27 y.o. G1P0000 at [redacted]w[redacted]d by  05/10/2017, by Last Menstrual  Period presenting for routine prenatal visit  Plan   pregnancy #1 Problems (from 08/03/16 to present)    Problem Noted Resolved   Supervision of low-risk pregnancy 09/22/2016 by Conard Novak, MD No   Overview Addendum 03/02/2017  9:23 AM by Conard Novak, MD    [facility] Prenatal Labs  Dating L=8 Blood type: B/Positive/-- (09/10 1440)   Genetic Screen 1 Screen: neg  AFP:   Antibody:Negative (09/10 1440)  Anatomic [place] complete Rubella: 10.30 (09/10 1440) Varicella:    GTT Third trimester: 125 RPR: Non Reactive (09/10 1440)   Rhogam n/a HBsAg: Negative (09/10 1440)   TDaP vaccine                        Flu Shot: [ ]  offered HIV:   Negative  Baby Food                                GBS:   Contraception  Pap:  CBB     CS/VBAC    Support Person               GAD (generalized anxiety disorder) 07/27/2014 by Edwena Felty, MD No   Overview Signed 07/27/2014  9:35 AM by Edwena Felty, MD    Followed by Dr. Sherlean Foot , [facility]. Was previously on Fluoxetine and Adderall but has not needed it recently.          Preterm labor symptoms and general obstetric precautions including but not limited to vaginal bleeding, contractions, leaking of fluid and fetal movement were reviewed in detail with the patient. Please refer to After Visit Summary for other counseling recommendations.   Return in about 2 weeks (around 03/30/2017) for Routine Prenatal Appointment.  Thomasene MohairStephen Karolyne Timmons, MD, Merlinda FrederickFACOG [facility], [facility] 03/16/2017 10:38 AM

## 2017-03-25 ENCOUNTER — Telehealth: Payer: Self-pay

## 2017-03-25 NOTE — Telephone Encounter (Signed)
[facility] Pt has head cold s&s. She thought it was allergies & has been taking zyrtec w/o relief. She has a sore throat & nasal congestion. Advised can use OTC nasal Saline or Afrin if needed, but Afrin only for 3 days. Can use Sudafed or Mucinex for nasal congestion & Robitussin for cough. Pt advised to remain well hydrated and contact us if s&s worsen or not better after 7 days.

## 2017-03-30 ENCOUNTER — Ambulatory Visit (INDEPENDENT_AMBULATORY_CARE_PROVIDER_SITE_OTHER): Payer: BLUE CROSS/BLUE SHIELD | Admitting: Obstetrics and Gynecology

## 2017-03-30 ENCOUNTER — Encounter: Payer: Self-pay | Admitting: Obstetrics and Gynecology

## 2017-03-30 VITALS — BP 122/78 | Wt 156.0 lb

## 2017-03-30 DIAGNOSIS — Z3493 Encounter for supervision of normal pregnancy, unspecified, third trimester: Secondary | ICD-10-CM

## 2017-03-30 DIAGNOSIS — Z3A34 34 weeks gestation of pregnancy: Secondary | ICD-10-CM

## 2017-03-30 NOTE — Patient Instructions (Signed)

## 2017-03-30 NOTE — Progress Notes (Signed)
Routine Prenatal Care Visit  Subjective  Dorothy Townsend is a 27 y.o. G1P0000 at [redacted]w[redacted]d being seen today for ongoing prenatal care.  She is currently monitored for the following issues for this low-risk pregnancy and has GAD (generalized anxiety disorder); Irritable bowel syndrome with constipation; Screening cholesterol level; Other fatigue; Supervision of low-risk pregnancy; Abdominal pain affecting pregnancy, antepartum; and Round ligament pain on their problem list.  ----------------------------------------------------------------------------------- Patient reports no complaints.   Contractions: Irritability. Vag. Bleeding: None.  Movement: Present. Denies leaking of fluid.  ----------------------------------------------------------------------------------- The following portions of the patient's history were reviewed and updated as appropriate: allergies, current medications, past family history, past medical history, past social history, past surgical history and problem list. Problem list updated.   Objective  Blood pressure 122/78, weight 156 lb (70.8 kg), last menstrual period 08/03/2016. Pregravid weight 118 lb (53.5 kg) Total Weight Gain 38 lb (17.2 kg) Urinalysis: Urine Protein: Negative Urine Glucose: Negative  Fetal Status: Fetal Heart Rate (bpm): 145 Fundal Height: 34 cm Movement: Present  Presentation: Vertex  General:  Alert, oriented and cooperative. Patient is in no acute distress.  Skin: Skin is warm and dry. No rash noted.   Cardiovascular: Normal heart rate noted  Respiratory: Normal respiratory effort, no problems with respiration noted  Abdomen: Soft, gravid, appropriate for gestational age. Pain/Pressure: Present     Pelvic:  Cervical exam deferred        Extremities: Normal range of motion.     Mental Status: Normal mood and affect. Normal behavior. Normal judgment and thought content.   Assessment   27 y.o. G1P0000 at [redacted]w[redacted]d by  05/10/2017, by Last Menstrual  Period presenting for routine prenatal visit  Plan   pregnancy #1 Problems (from 08/03/16 to present)    Problem Noted Resolved   Supervision of low-risk pregnancy 09/22/2016 by Conard Novak, MD No   Overview Addendum 03/02/2017  9:23 AM by Conard Novak, MD    [facility] Prenatal Labs  Dating L=8 Blood type: B/Positive/-- (09/10 1440)   Genetic Screen 1 Screen: neg  AFP:   Antibody:Negative (09/10 1440)  Anatomic [place] complete Rubella: 10.30 (09/10 1440) Varicella:    GTT Third trimester: 125 RPR: Non Reactive (09/10 1440)   Rhogam n/a HBsAg: Negative (09/10 1440)   TDaP vaccine                        Flu Shot: [ ]  offered HIV:   Negative  Baby Food                                GBS:   Contraception  Pap:  CBB     CS/VBAC    Support Person               GAD (generalized anxiety disorder) 07/27/2014 by Edwena Felty, MD No   Overview Signed 07/27/2014  9:35 AM by Edwena Felty, MD    Followed by Dr. Sherlean Foot , [facility]. Was previously on Fluoxetine and Adderall but has not needed it recently.          Preterm labor symptoms and general obstetric precautions including but not limited to vaginal bleeding, contractions, leaking of fluid and fetal movement were reviewed in detail with the patient. Please refer to After Visit Summary for other counseling recommendations.   Return in about 2 weeks (around 04/13/2017) for Routine Prenatal Appointment.  Thomasene MohairStephen Corrin Hingle, MD, Merlinda FrederickFACOG [facility], [facility] 03/30/2017 11:09 AM

## 2017-04-09 ENCOUNTER — Telehealth: Payer: Self-pay

## 2017-04-09 NOTE — Telephone Encounter (Signed)
Pt called to get information about what kind of creme she could put on a rash that has been red and itching for 2 weeks. Pt advise she could use hydrocortisone cream, calamine lotion or benadryl cream on her hand but if the rash starts to spread or she becomes itchy all over to come and be seen. Pt has an appt on Monday.

## 2017-04-13 ENCOUNTER — Observation Stay: Payer: BLUE CROSS/BLUE SHIELD | Admitting: Anesthesiology

## 2017-04-13 ENCOUNTER — Inpatient Hospital Stay
Admission: EM | Admit: 2017-04-13 | Discharge: 2017-04-16 | DRG: 806 | Disposition: A | Discharge status: Home / Self Care | Payer: BLUE CROSS/BLUE SHIELD | Attending: Obstetrics and Gynecology | Admitting: Obstetrics and Gynecology

## 2017-04-13 ENCOUNTER — Ambulatory Visit (INDEPENDENT_AMBULATORY_CARE_PROVIDER_SITE_OTHER): Payer: BLUE CROSS/BLUE SHIELD | Admitting: Obstetrics and Gynecology

## 2017-04-13 ENCOUNTER — Encounter: Payer: Self-pay | Admitting: Obstetrics and Gynecology

## 2017-04-13 ENCOUNTER — Other Ambulatory Visit: Payer: Self-pay

## 2017-04-13 VITALS — BP 118/74 | Wt 162.0 lb

## 2017-04-13 DIAGNOSIS — F411 Generalized anxiety disorder: Secondary | ICD-10-CM

## 2017-04-13 DIAGNOSIS — D62 Acute posthemorrhagic anemia: Secondary | ICD-10-CM | POA: Diagnosis not present

## 2017-04-13 DIAGNOSIS — O9081 Anemia of the puerperium: Secondary | ICD-10-CM | POA: Diagnosis not present

## 2017-04-13 DIAGNOSIS — Z3493 Encounter for supervision of normal pregnancy, unspecified, third trimester: Secondary | ICD-10-CM

## 2017-04-13 DIAGNOSIS — Z3A36 36 weeks gestation of pregnancy: Secondary | ICD-10-CM

## 2017-04-13 LAB — CBC
HCT: 36 % (ref 35.0–47.0)
Hemoglobin: 12.2 g/dL (ref 12.0–16.0)
MCH: 30.3 pg (ref 26.0–34.0)
MCHC: 33.9 g/dL (ref 32.0–36.0)
MCV: 89.4 fL (ref 80.0–100.0)
Platelets: 175 K/uL (ref 150–440)
RBC: 4.03 MIL/uL (ref 3.80–5.20)
RDW: 13 % (ref 11.5–14.5)
WBC: 10.2 K/uL (ref 3.6–11.0)

## 2017-04-13 LAB — TYPE AND SCREEN
ABO/RH(D): B POS
Antibody Screen: NEGATIVE

## 2017-04-13 MED ORDER — EPHEDRINE 5 MG/ML INJ
10.0000 mg | INTRAVENOUS | Status: DC | PRN
  Filled 2017-04-13: qty 2

## 2017-04-13 MED ORDER — FENTANYL 2.5 MCG/ML W/ROPIVACAINE 0.15% IN NS 100 ML EPIDURAL (ARMC)
EPIDURAL | Status: AC
  Filled 2017-04-13: qty 100

## 2017-04-13 MED ORDER — OXYTOCIN BOLUS FROM INFUSION
500.0000 mL | Freq: Once | INTRAVENOUS | Status: AC
  Administered 2017-04-14: 500 mL via INTRAVENOUS

## 2017-04-13 MED ORDER — FENTANYL 2.5 MCG/ML W/ROPIVACAINE 0.15% IN NS 100 ML EPIDURAL (ARMC)
12.0000 mL/h | EPIDURAL | Status: DC
  Administered 2017-04-13: 12 mL/h via EPIDURAL
  Filled 2017-04-13: qty 100

## 2017-04-13 MED ORDER — PENICILLIN G POT IN DEXTROSE 60000 UNIT/ML IV SOLN
3.0000 10*6.[IU] | INTRAVENOUS | Status: DC
  Administered 2017-04-13 – 2017-04-14 (×3): 3 10*6.[IU] via INTRAVENOUS
  Filled 2017-04-13 (×6): qty 50

## 2017-04-13 MED ORDER — BUPIVACAINE HCL (PF) 0.25 % IJ SOLN
INTRAMUSCULAR | Status: DC | PRN
  Administered 2017-04-13 (×3): 5 mL via EPIDURAL

## 2017-04-13 MED ORDER — SOD CITRATE-CITRIC ACID 500-334 MG/5ML PO SOLN: 30.0000 mL | ORAL | Status: DC | PRN

## 2017-04-13 MED ORDER — PHENYLEPHRINE 40 MCG/ML (10ML) SYRINGE FOR IV PUSH (FOR BLOOD PRESSURE SUPPORT)
80.0000 ug | PREFILLED_SYRINGE | INTRAVENOUS | Status: DC | PRN
  Filled 2017-04-13: qty 5

## 2017-04-13 MED ORDER — LIDOCAINE HCL (PF) 1 % IJ SOLN: 30.0000 mL | INTRAMUSCULAR | Status: DC | PRN

## 2017-04-13 MED ORDER — AMMONIA AROMATIC IN INHA
RESPIRATORY_TRACT | Status: AC
  Filled 2017-04-13: qty 10

## 2017-04-13 MED ORDER — LIDOCAINE HCL (PF) 1 % IJ SOLN
INTRAMUSCULAR | Status: AC
  Filled 2017-04-13: qty 30

## 2017-04-13 MED ORDER — OXYTOCIN 10 UNIT/ML IJ SOLN
INTRAMUSCULAR | Status: AC
  Filled 2017-04-13: qty 2

## 2017-04-13 MED ORDER — OXYTOCIN 40 UNITS IN LACTATED RINGERS INFUSION - SIMPLE MED
2.5000 [IU]/h | INTRAVENOUS | Status: DC
  Filled 2017-04-13: qty 1000

## 2017-04-13 MED ORDER — DIPHENHYDRAMINE HCL 50 MG/ML IJ SOLN
12.5000 mg | INTRAMUSCULAR | Status: DC | PRN
  Administered 2017-04-14: 12.5 mg via INTRAVENOUS
  Filled 2017-04-13: qty 1

## 2017-04-13 MED ORDER — OXYTOCIN 10 UNIT/ML IJ SOLN: 10.0000 [IU] | Freq: Once | INTRAMUSCULAR | Status: DC

## 2017-04-13 MED ORDER — MISOPROSTOL 200 MCG PO TABS
ORAL_TABLET | ORAL | Status: AC
  Filled 2017-04-13: qty 4

## 2017-04-13 MED ORDER — LACTATED RINGERS IV SOLN: 500.0000 mL | Freq: Once | INTRAVENOUS | Status: DC

## 2017-04-13 MED ORDER — ONDANSETRON HCL 4 MG/2ML IJ SOLN
4.0000 mg | Freq: Four times a day (QID) | INTRAMUSCULAR | Status: DC | PRN
  Administered 2017-04-14: 4 mg via INTRAVENOUS
  Filled 2017-04-13: qty 2

## 2017-04-13 MED ORDER — LACTATED RINGERS IV SOLN: 500.0000 mL | INTRAVENOUS | Status: DC | PRN

## 2017-04-13 MED ORDER — LACTATED RINGERS IV SOLN
INTRAVENOUS | Status: DC
  Administered 2017-04-13: 12:00:00 via INTRAVENOUS

## 2017-04-13 MED ORDER — LIDOCAINE HCL (PF) 1 % IJ SOLN
INTRAMUSCULAR | Status: DC | PRN
  Administered 2017-04-13: 3 mL

## 2017-04-13 MED ORDER — LACTATED RINGERS IV SOLN
INTRAVENOUS | Status: DC
  Administered 2017-04-13: 21:00:00 via INTRAVENOUS

## 2017-04-13 MED ORDER — SODIUM CHLORIDE 0.9 % IV SOLN
5.0000 10*6.[IU] | Freq: Once | INTRAVENOUS | Status: AC
  Administered 2017-04-13: 5 10*6.[IU] via INTRAVENOUS
  Filled 2017-04-13: qty 5

## 2017-04-13 NOTE — Anesthesia Preprocedure Evaluation (Signed)
Anesthesia Evaluation  Patient identified by MRN, date of birth, ID band Patient awake    Reviewed: Allergy & Precautions, NPO status , Patient's Chart, lab work & pertinent test results  History of Anesthesia Complications Negative for: history of anesthetic complications  Airway Mallampati: II  TM Distance: >3 FB Neck ROM: Full    Dental no notable dental hx.    Pulmonary neg pulmonary ROS, neg sleep apnea, neg COPD,    breath sounds clear to auscultation- rhonchi (-) wheezing      Cardiovascular Exercise Tolerance: Good (-) hypertension(-) CAD and (-) Past MI  Rhythm:Regular Rate:Normal - Systolic murmurs and - Diastolic murmurs    Neuro/Psych PSYCHIATRIC DISORDERS Anxiety negative neurological ROS     GI/Hepatic negative GI ROS, Neg liver ROS,   Endo/Other  negative endocrine ROSneg diabetes  Renal/GU negative Renal ROS     Musculoskeletal negative musculoskeletal ROS (+)   Abdominal (+) - obese, Gravid abdomen   Peds  Hematology negative hematology ROS (+)   Anesthesia Other Findings Past Medical History: No date: Acne No date: ADD (attention deficit disorder) No date: ADHD (attention deficit hyperactivity disorder)     Comment:  history of No date: Anxiety     Comment:  history of No date: Constipation No date: Immunization, viral disease     Comment:  GARDASIL COMPLETED No date: Personal history of kidney stones   Reproductive/Obstetrics (+) Pregnancy                             Lab Results  Component Value Date   WBC 10.2 04/13/2017   HGB 12.2 04/13/2017   HCT 36.0 04/13/2017   MCV 89.4 04/13/2017   PLT 175 04/13/2017    Anesthesia Physical Anesthesia Plan  ASA: II  Anesthesia Plan: Epidural   Post-op Pain Management:    Induction:   PONV Risk Score and Plan: 2  Airway Management Planned:   Additional Equipment:   Intra-op Plan:   Post-operative  Plan:   Informed Consent: I have reviewed the patients History and Physical, chart, labs and discussed the procedure including the risks, benefits and alternatives for the proposed anesthesia with the patient or authorized representative who has indicated his/her understanding and acceptance.     Plan Discussed with: CRNA and Anesthesiologist  Anesthesia Plan Comments: (Plan for epidural for labor, discussed epidural vs spinal vs GA if need for csection)        Anesthesia Quick Evaluation

## 2017-04-13 NOTE — Anesthesia Procedure Notes (Signed)
Epidural Patient location during procedure: OB Start time: 04/13/2017 10:24 PM End time: 04/13/2017 10:40 PM  Staffing Anesthesiologist: Alver FisherPenwarden, Jamaris Theard, MD Performed: anesthesiologist   Preanesthetic Checklist Completed: patient identified, site marked, surgical consent, pre-op evaluation, timeout performed, IV checked, risks and benefits discussed and monitors and equipment checked  Epidural Patient position: sitting Prep: ChloraPrep Patient monitoring: heart rate, continuous pulse ox and blood pressure Approach: midline Location: L3-L4 Injection technique: LOR saline  Needle:  Needle type: Tuohy  Needle gauge: 18 G Needle length: 9 cm and 9 Needle insertion depth: 5.5 cm Catheter type: closed end flexible Catheter size: 20 Guage Catheter at skin depth: 10 cm Test dose: negative (0.125% bupivacaine)  Assessment Events: blood not aspirated, injection not painful, no injection resistance, negative IV test and no paresthesia  Additional Notes   Patient tolerated the insertion well without complications.Reason for block:procedure for pain

## 2017-04-13 NOTE — Progress Notes (Incomplete)
Routine Prenatal Care Visit  Subjective  Dorothy Townsend is a 27 y.o. G1P0000 at 4019w1d being seen today for ongoing prenatal care.  She is currently monitored for the following issues for this {Blank single:19197::"high-risk","low-risk"} pregnancy and has GAD (generalized anxiety disorder); Irritable bowel syndrome with constipation; Screening cholesterol level; Other fatigue; Supervision of low-risk pregnancy; Abdominal pain affecting pregnancy, antepartum; Round ligament pain; and Preterm labor on their problem list.  ----------------------------------------------------------------------------------- Patient reports {sx:[id number]}.   Contractions: Irregular. Vag. Bleeding: None, Bloody Show.  Movement: (!) Decreased. Denies leaking of fluid.  ----------------------------------------------------------------------------------- The following portions of the patient's history were reviewed and updated as appropriate: allergies, current medications, past family history, past medical history, past social history, past surgical history and problem list. Problem list updated.   Objective  Blood pressure 118/74, weight 162 lb (73.5 kg), last menstrual period 08/03/2016. Pregravid weight 118 lb (53.5 kg) Total Weight Gain 44 lb (20 kg) Urinalysis: Urine Protein: Negative Urine Glucose: Negative  Fetal Status: Fetal Heart Rate (bpm): 140 Fundal Height: 36 cm Movement: (!) Decreased  Presentation: Vertex  General:  Alert, oriented and cooperative. Patient is in no acute distress.  Skin: Skin is warm and dry. No rash noted.   Cardiovascular: Normal heart rate noted  Respiratory: Normal respiratory effort, no problems with respiration noted  Abdomen: Soft, gravid, appropriate for gestational age. Pain/Pressure: Present     Pelvic:  {Blank single:19197::"Cervical exam performed","Cervical exam deferred"} Dilation: 4 Effacement (%): 70 Station: -2  Extremities: Normal range of motion.     Mental Status:  Normal mood and affect. Normal behavior. Normal judgment and thought content.   Assessment   [age] y.o. G1P0000 at 1819w1d by  05/10/2017, by Last Menstrual Period presenting for {Blank single:19197::"routine","work-in"} prenatal visit  Plan   pregnancy #1 Problems (from 08/03/16 to present)    Problem Noted Resolved   Preterm labor 04/13/2017 by Conard NovakJackson, Stephen D, MD No   Supervision of low-risk pregnancy 09/22/2016 by Conard NovakJackson, Stephen D, MD No   Overview Addendum 03/02/2017  9:23 AM by Conard NovakJackson, Stephen D, MD    [facility] Prenatal Labs  Dating L=8 Blood type: B/Positive/-- (09/10 1440)   Genetic Screen 1 Screen: neg  AFP:   Antibody:Negative (09/10 1440)  Anatomic US complete Rubella: 10.30 (09/10 1440) Varicella:    GTT Third trimester: 125 RPR: Non Reactive (09/10 1440)   Rhogam n/a HBsAg: Negative (09/10 1440)   TDaP vaccine                        Flu Shot: [ ]  offered HIV:   Negative  Baby Food                                GBS:   Contraception  Pap:  CBB     CS/VBAC    Support Person               GAD (generalized anxiety disorder) 07/27/2014 by Edwena FeltySundaram, Ashany, MD No   Overview Signed 07/27/2014  9:35 AM by Edwena FeltySundaram, Ashany, MD    Followed by Dr. Sherlean FootBrian Wall , [facility]. Was previously on Fluoxetine and Adderall but has not needed it recently.          {Blank single:19197::"Term","Preterm"} labor symptoms and general obstetric precautions including but not limited to vaginal bleeding, contractions, leaking of fluid and fetal movement were reviewed in detail with the patient. Please  refer to After Visit Summary for other counseling recommendations.   Return in about 1 week (around 04/20/2017) for Routine Prenatal Appointment.  [name], MD, [facility], [facility] 04/13/2017 11:10 AM

## 2017-04-13 NOTE — H&P (Signed)
OB History & Physical   History of Present Illness:  Chief Complaint: contractions  HPI:  Dorothy Townsend is a 27 y.o. G1P0000 female at [redacted]w[redacted]d dated by LMP consistent with an 8 week ultrasound.  Her pregnancy has been uncomplicated.    She reports contractions (really back pain and some lower abdominal pain).   She denies leakage of fluid.   She denies vaginal bleeding.   She reports fetal movement.    Maternal Medical History:   Past Medical History:  Diagnosis Date  . Acne   . ADD (attention deficit disorder)   . ADHD (attention deficit hyperactivity disorder)    history of  . Anxiety    history of  . Constipation   . Immunization, viral disease    GARDASIL COMPLETED  . Personal history of kidney stones     Past Surgical History:  Procedure Laterality Date  . EXCISION OF KELOID    . EXTERNAL EAR SURGERY Bilateral 2012   NODULES REMOVED   . TONSILLECTOMY  2015  . WISDOM TOOTH EXTRACTION      Allergies  Allergen Reactions  . Fluvoxamine Anxiety    Prior to Admission medications   Medication Sig Start Date End Date Taking? Authorizing Provider  Prenatal Vit-Fe Fumarate-FA (PRENATAL VITAMIN PO) Take by mouth.   Yes [provider]    OB History  Gravida Para Term Preterm AB Living  1 0 0 0 0 0  SAB TAB Ectopic Multiple Live Births  0 0 0 0 0    # Outcome Date GA Lbr Len/2nd Weight Sex Delivery Anes PTL Lv  1 Current             Prenatal care site: [facility] Social History: She  reports that she has never smoked. She has never used smokeless tobacco. She reports that she drinks alcohol. She reports that she does not use drugs.  Family History: family history includes Cancer in her other; Diabetes in her maternal grandfather.   Review of Systems:  Review of Systems  Constitutional: Negative.   HENT: Negative.   Eyes: Negative.   Respiratory: Negative.   Cardiovascular: Negative.   Gastrointestinal: Positive for abdominal pain  (contractions).  Genitourinary: Negative.   Musculoskeletal: Negative.   Skin: Negative.   Neurological: Negative.   Psychiatric/Behavioral: Negative.      Physical Exam:  Vital Signs: BP 134/81 (BP Location: Left Arm)   Pulse 99   Temp 98.9 F (37.2 C) (Oral)   LMP 08/03/2016 (Exact Date)  Constitutional: Well nourished, well developed female in no acute distress.  HEENT: normal Skin: Warm and dry.  Cardiovascular: Regular rate and rhythm.   Extremity: trace edema  Respiratory: Clear to auscultation bilateral. Normal respiratory effort Abdomen: FHT present Back: no CVAT Neuro: DTRs 2+, Cranial nerves grossly intact Psych: Alert and Oriented x3. No memory deficits. Normal mood and affect.  MS: normal gait, normal bilateral lower extremity ROM/strength/stability.  Pelvic exam: (female chaperone present) is not limited by body habitus EGBUS: within normal limits Vagina: within normal limits and with no blood in the vault Cervix: 4/70/-2 (female RN chaperone present)  Pertinent Results:  Prenatal Labs: Blood type/Rh B positive  Antibody screen negative  Rubella Immune  Varicella Immune    RPR NR  HBsAg negative  HIV negative  GC negative  Chlamydia negative  Genetic screening 1st trim screen neg, msAFP declined  1 hour GTT 123  3 hour GTT n/a  GBS Unknown.  Collected on 04/13/17  Baseline FHR: 140 beats/min   Variability: moderate   Accelerations: present   Decelerations: absent Contractions: present frequency: 4 q 10 min Overall assessment: cat 1  Bedside Ultrasound:  Number of Fetus: 1  Presentation: cephalic  Assessment:  Dorothy Townsend is a 27 y.o. 661P0000 female at 2023w1d with preterm labor.   Plan:  1. Admit to Labor & Delivery  2. CBC, T&S, Clrs, IVF 3. GBS unknown: ampicillin.   4. Fetwal well-being: reassuring.  Discussed BMTZ with neonatology and with patient.  Will hold off given uncertainly of whether she will deliver.  Pros/cons discussed  with patient, including uncertainty of benefit and uncertainty of risk.   5. Will not augment at this time given early gestational age and early stage of labor.   Thomasene MohairStephen Karem Tomaso, MD 04/13/2017 1:40 PM

## 2017-04-13 NOTE — Progress Notes (Signed)
Routine Prenatal Care Visit  Subjective  Dorothy Townsend is a 27 y.o. G1P0000 at 6874w1d being seen today for ongoing prenatal care.  She is currently monitored for the following issues for this low-risk pregnancy and has GAD (generalized anxiety disorder); Irritable bowel syndrome with constipation; Screening cholesterol level; Other fatigue; Supervision of low-risk pregnancy; Abdominal pain affecting pregnancy, antepartum; Round ligament pain; and Preterm labor on their problem list.  ----------------------------------------------------------------------------------- Patient reports mid-back pain, severe, bilateral. sometime constant, other times it comes and goes. occasional discrete contractions. +cramping. .   Contractions: Irregular. Vag. Bleeding: None, Bloody Show.  Movement: (!) Decreased. Denies leaking of fluid.  ----------------------------------------------------------------------------------- The following portions of the patient's history were reviewed and updated as appropriate: allergies, current medications, past family history, past medical history, past social history, past surgical history and problem list. Problem list updated.   Objective  Blood pressure 118/74, weight 162 lb (73.5 kg), last menstrual period 08/03/2016. Pregravid weight 118 lb (53.5 kg) Total Weight Gain 44 lb (20 kg) Urinalysis: Urine Protein: Negative Urine Glucose: Negative  Fetal Status: Fetal Heart Rate (bpm): 140 Fundal Height: 36 cm Movement: (!) Decreased  Presentation: Vertex  General:  Alert, oriented and cooperative. Patient is in no acute distress.  Skin: Skin is warm and dry. No rash noted.   Cardiovascular: Normal heart rate noted  Respiratory: Normal respiratory effort, no problems with respiration noted  Abdomen: Soft, gravid, appropriate for gestational age. Pain/Pressure: Present     Pelvic:  Cervical exam performed Dilation: 4 Effacement (%): 70 Station: -2  Extremities: Normal  range of motion.     Mental Status: Normal mood and affect. Normal behavior. Normal judgment and thought content.   BSUS: cephalic presentation, +FM on BSUS.  Assessment   27 y.o. G1P0000 at 4174w1d by  05/10/2017, by Last Menstrual Period presenting for routine prenatal visit  Plan   pregnancy #1 Problems (from 08/03/16 to present)    Problem Noted Resolved   Preterm labor 04/13/2017 by Conard NovakJackson, Yeva Bissette D, MD No   Supervision of low-risk pregnancy 09/22/2016 by Conard NovakJackson, Toney Lizaola D, MD No   Overview Addendum 03/02/2017  9:23 AM by Conard NovakJackson, Serjio Deupree D, MD    Clinic [facility] Prenatal Labs  Dating L=8 Blood type: B/Positive/-- (09/10 1440)   Genetic Screen 1 Screen: neg  AFP:   Antibody:Negative (09/10 1440)  Anatomic US complete Rubella: 10.30 (09/10 1440) Varicella:    GTT Third trimester: 125 RPR: Non Reactive (09/10 1440)   Rhogam n/a HBsAg: Negative (09/10 1440)   TDaP vaccine 03/02/17  HIV:   Negative  Baby Food                                GBS: performed 4/1  Contraception  Pap: Neg 11/20/15  CBB     CS/VBAC    Support Person             GAD (generalized anxiety disorder) 07/27/2014 by Edwena FeltySundaram, Ashany, MD No   Overview Signed 07/27/2014  9:35 AM by Edwena FeltySundaram, Ashany, MD    Followed by Dr. Sherlean FootBrian Wall , [facility]. Was previously on Fluoxetine and Adderall but has not needed it recently.        Preterm labor symptoms and general obstetric precautions including but not limited to vaginal bleeding, contractions, leaking of fluid and fetal movement were reviewed in detail with the patient. Please refer to After Visit Summary for other counseling recommendations.   -  To L&D for further evaluation of preterm labor.  Discussed assessment for labor and whether to administer BMTZ.  Discussed uncertainly of long-term effects of BMTZ on neurocognitive development. Will hold BMTZ for now.   - Ampicillin if laboring.  - If able to be discharged, return to clinic in one week for  routine prenatal, or soon as dictated by clinical course.  [name], MD, [facility], [facility] 04/13/2017 11:10 AM

## 2017-04-13 NOTE — OB Triage Note (Signed)
4949w1d sent to BP d/t PTL for NST. Pt complains of contractions and spotting.

## 2017-04-13 NOTE — Progress Notes (Signed)
Patient ID: Dorothy Townsend, female   DOB: 06-24-1990, 27 y.o.   MRN: [id number]  Labor Check  Subj:  Complaints: still with same symptoms of cramping, no change   Obj:  BP 118/67   Pulse 91   Temp 98.1 F (36.7 C) (Oral)   Resp 17   LMP 08/03/2016 (Exact Date)  Dose (milli-units/min) Oxytocin: 0 milli-units/min  Cervix: Dilation: 4 / Effacement (%): 70 / Station: -2  Baseline FHR: 140 beats/min   Variability: moderate   Accelerations: present   Decelerations: absent Contractions: present frequency: 2 q 10 min (not tracing well) Overall assessment: cat 1  A/P: 27 y.o. G1P0000 female at 7872w1d with preterm labor.  1.  Labor: monitor ctx and cervix for change.  Recheck PRN or prior to discharge  2.  FWB: reassuring, Overall assessment: category 1  3.  GBS unknown.  PCN per protocol  4.  Pain: no meds indicated now 5.  Recheck: prn and/or prior to discharge   Thomasene MohairStephen Necola Bluestein, MD, Merlinda FrederickFACOG [facility], [facility] 04/13/2017 5:52 PM

## 2017-04-14 DIAGNOSIS — D62 Acute posthemorrhagic anemia: Secondary | ICD-10-CM | POA: Diagnosis not present

## 2017-04-14 DIAGNOSIS — Z3A36 36 weeks gestation of pregnancy: Secondary | ICD-10-CM | POA: Diagnosis not present

## 2017-04-14 DIAGNOSIS — O9081 Anemia of the puerperium: Secondary | ICD-10-CM | POA: Diagnosis not present

## 2017-04-14 LAB — RPR: RPR Ser Ql: NONREACTIVE

## 2017-04-14 MED ORDER — PRENATAL MULTIVITAMIN CH
1.0000 | ORAL_TABLET | Freq: Every day | ORAL | Status: DC
  Administered 2017-04-15 – 2017-04-16 (×2): 1 via ORAL
  Filled 2017-04-14 (×2): qty 1

## 2017-04-14 MED ORDER — SODIUM CHLORIDE 0.9 % IV SOLN
1.0000 g | INTRAVENOUS | Status: DC
  Administered 2017-04-14: 1 g via INTRAVENOUS
  Filled 2017-04-14 (×7): qty 1000

## 2017-04-14 MED ORDER — ONDANSETRON HCL 4 MG PO TABS: 4.0000 mg | ORAL_TABLET | ORAL | Status: DC | PRN

## 2017-04-14 MED ORDER — FERROUS SULFATE 325 (65 FE) MG PO TABS
325.0000 mg | ORAL_TABLET | Freq: Two times a day (BID) | ORAL | Status: DC
  Administered 2017-04-14 – 2017-04-16 (×5): 325 mg via ORAL
  Filled 2017-04-14 (×5): qty 1

## 2017-04-14 MED ORDER — FAMOTIDINE 40 MG/5ML PO SUSR
20.0000 mg | Freq: Two times a day (BID) | ORAL | Status: DC
  Filled 2017-04-14 (×2): qty 2.5

## 2017-04-14 MED ORDER — DIBUCAINE 1 % RE OINT
1.0000 "application " | TOPICAL_OINTMENT | RECTAL | Status: DC | PRN
  Filled 2017-04-14 (×2): qty 28

## 2017-04-14 MED ORDER — ONDANSETRON HCL 4 MG/2ML IJ SOLN
4.0000 mg | INTRAMUSCULAR | Status: DC | PRN
  Administered 2017-04-14: 4 mg via INTRAVENOUS
  Filled 2017-04-14: qty 2

## 2017-04-14 MED ORDER — SIMETHICONE 80 MG PO CHEW: 80.0000 mg | CHEWABLE_TABLET | ORAL | Status: DC | PRN

## 2017-04-14 MED ORDER — ONDANSETRON HCL 4 MG/2ML IJ SOLN: 4.0000 mg | INTRAMUSCULAR | Status: DC | PRN

## 2017-04-14 MED ORDER — IBUPROFEN 600 MG PO TABS
600.0000 mg | ORAL_TABLET | Freq: Four times a day (QID) | ORAL | Status: DC
  Administered 2017-04-14 – 2017-04-16 (×9): 600 mg via ORAL
  Filled 2017-04-14 (×8): qty 1

## 2017-04-14 MED ORDER — BENZOCAINE-MENTHOL 20-0.5 % EX AERO
1.0000 "application " | INHALATION_SPRAY | CUTANEOUS | Status: DC | PRN
  Filled 2017-04-14 (×3): qty 56

## 2017-04-14 MED ORDER — HYDROCODONE-ACETAMINOPHEN 5-325 MG PO TABS: 1.0000 | ORAL_TABLET | ORAL | Status: DC | PRN

## 2017-04-14 MED ORDER — WITCH HAZEL-GLYCERIN EX PADS
1.0000 "application " | MEDICATED_PAD | CUTANEOUS | Status: DC | PRN
  Filled 2017-04-14: qty 100

## 2017-04-14 MED ORDER — IBUPROFEN 600 MG PO TABS
ORAL_TABLET | ORAL | Status: AC
  Filled 2017-04-14: qty 1

## 2017-04-14 MED ORDER — DIPHENHYDRAMINE HCL 25 MG PO CAPS: 25.0000 mg | ORAL_CAPSULE | Freq: Four times a day (QID) | ORAL | Status: DC | PRN

## 2017-04-14 MED ORDER — OXYTOCIN 40 UNITS IN LACTATED RINGERS INFUSION - SIMPLE MED
1.0000 m[IU]/min | INTRAVENOUS | Status: DC
  Administered 2017-04-14 (×2): 1 m[IU]/min via INTRAVENOUS

## 2017-04-14 MED ORDER — SENNOSIDES-DOCUSATE SODIUM 8.6-50 MG PO TABS
2.0000 | ORAL_TABLET | ORAL | Status: DC
  Administered 2017-04-14 – 2017-04-15 (×2): 2 via ORAL
  Filled 2017-04-14 (×2): qty 2

## 2017-04-14 MED ORDER — COCONUT OIL OIL
1.0000 "application " | TOPICAL_OIL | Status: DC | PRN
  Filled 2017-04-14 (×2): qty 120

## 2017-04-14 MED ORDER — ACETAMINOPHEN 325 MG PO TABS: 650.0000 mg | ORAL_TABLET | ORAL | Status: DC | PRN

## 2017-04-14 MED ORDER — TERBUTALINE SULFATE 1 MG/ML IJ SOLN: 0.2500 mg | Freq: Once | INTRAMUSCULAR | Status: DC | PRN

## 2017-04-14 NOTE — Progress Notes (Signed)
Patient ID: Dorothy DialsMeredith A Townsend, female   DOB: Jan 27, 1990, 27 y.o.   MRN: [id number]  Labor Check  Subj:  Complaints: comfortable with epidural   Obj:  BP 121/67   Pulse 83   Temp 97.6 F (36.4 C) (Oral)   Resp 18   LMP 08/03/2016 (Exact Date)   SpO2 100%  Dose (milli-units/min) Oxytocin: 0 milli-units/min  Cervix: Dilation: 6.5 / Effacement (%): 80 / Station: 0  Baseline FHR: 130 beats/min   Variability: moderate   Accelerations: present   Decelerations: absent Contractions: present frequency: 3-4 q 10 min Overall assessment: category 1  A/P: 27 y.o. G1P0000 female at 2015w2d with preterm labor, advanced cervical dilation.  1.  Labor: long discussion with patient and her husband regarding arrest of labor with continued contractions and advanced cervical dilation. She has had a stop-start progression of her labor progress with extended periods of arrest of dilation followed by dilation for a short period of time. Discussed whether delivery was inevitable soon or if we could potentially get more time from the pregnancy by waiting and seeing what happens. Given her labor course, it appears she we are talking about hours instead of days before an inevitable delivery.  We discussed the risk of infection with advanced cervical dilation and balancing that risk with delaying delivery for fetal benefit.  Given that my clinical impression is that this process, overall, is going to continue, risk of infection is what we are mitigating by augmenting the process.  She and her husband are in agreement that augmenting at this point and under these circumstances makes sense to try to reduce the risk of infection in a late preterm delivery.  To this end, will start augmentation with pitocin and will consider AROM at some point is ROM does not occur spontaneously. 2.  FWB: reassuring, Overall assessment: category 1  3.  GBS unknown, continue PCN  4.  Pain: epidural 5.  Recheck: prn   Thomasene MohairStephen Rayfield Beem, MD,  Merlinda FrederickFACOG [facility], [facility] 04/14/2017 3:15 AM

## 2017-04-14 NOTE — Discharge Summary (Signed)
OB Discharge Summary     Patient Name: Dorothy Townsend DOB: February 10, 1990 MRN: [id number]  Date of admission: 04/13/2017 Delivering MD: [name], MD  Date of Delivery: 04/14/2017  Date of discharge:04/16/2017 Admitting diagnosis: Preterm labor Intrauterine pregnancy: [redacted]w[redacted]d     Secondary diagnosis: None     Discharge diagnosis: Preterm Pregnancy Delivered                                                                                                Post partum procedures:none  Augmentation: AROM and Pitocin  Complications: None  Hospital course:  Onset of Labor With Vaginal Delivery     27 y.o. yo G1P0101 at [redacted]w[redacted]d was admitted in Active Labor on 04/13/2017. Patient had an uncomplicated labor course as follows:  Membrane Rupture Time/Date: 9:01 AM ,04/14/2017   Intrapartum Procedures: Episiotomy: None [1]                                         Lacerations:  1st degree [2];Vaginal [6]  Patient had a delivery of a Viable preterm female infant. 04/14/2017  Information for the patient's newborn:  Sicily, Zaragoza [[id number]]  Delivery Method: Vag-Spont    Pateint had an uncomplicated postpartum course.  She is ambulating, tolerating a regular diet, passing flatus, and urinating well. Patient is discharged home in stable condition on 04/16/2017   Physical exam  Vital signs: BP 117/72 (BP Location: Right Arm)   Pulse 76   Temp 99 F (37.2 C) (Oral)   Resp 20   LMP 08/03/2016 (Exact Date)   SpO2 98%   Breastfeeding? Unknown     General: alert and cooperative, tearful about being discharged without baby who is in NICU with hypoglycemia issues Lochia: appropriate Uterine Fundus: firm/ U-1/ML/NT Incision: Healing well with no significant drainage DVT Evaluation: No evidence of DVT seen on physical exam.  Labs: Lab Results  Component Value Date   WBC 8.1 04/15/2017   HGB 10.6 (L) 04/15/2017   HCT 32.1 (L) 04/15/2017   MCV 90.3 04/15/2017   PLT 154 04/15/2017    Discharge  instruction: per After Visit Summary.  Medications:  Allergies as of 04/16/2017      Reactions   Fluvoxamine Anxiety      Medication List    STOP taking these medications   cefixime 400 MG Caps capsule Commonly known as:  SUPRAX     TAKE these medications   acetaminophen 325 MG tablet Commonly known as:  TYLENOL Take 2 tablets (650 mg total) by mouth every 4 (four) hours as needed for mild pain or moderate pain. What changed:    medication strength  how much to take  when to take this  reasons to take this   docusate sodium 100 MG capsule Commonly known as:  COLACE Take 1 capsule (100 mg total) by mouth daily as needed for mild constipation.   ibuprofen 600 MG tablet Commonly known as:  ADVIL,MOTRIN Take 1 tablet (600 mg total) by mouth every 6 (six)  hours as needed for mild pain, moderate pain or cramping.   PRENATAL VITAMIN PO Take by mouth.   pseudoephedrine 30 MG/5ML syrup Commonly known as:  SUDAFED Take by mouth 4 (four) times daily as needed for congestion.     B POS/ RI/ VI  Diet: routine diet  Activity: Advance as tolerated. Pelvic rest for 6 weeks.   Outpatient follow up: Follow-up Information    Conard NovakJackson, Stephen D, MD Follow up in 2 week(s).   Specialty:  Obstetrics and Gynecology Why:  post partum depression check Contact information: [address] [place] [address] [phone]             Postpartum contraception: Condoms Rhogam Given postpartum: no Rubella vaccine given postpartum: no Varicella vaccine given postpartum: no TDaP given antepartum or postpartum: received 03/02/17  Newborn Data: Live born female / Madaline G[place] Birth Weight:  5# 8.2 oz  APGAR: 8, 9  Newborn Delivery   Birth date/time:  04/14/2017 12:20:00 Delivery type:  Vaginal, Spontaneous    Baby Feeding: Breast  Disposition:NICU  SIGNED: Farrel Connersolleen Rodrigues Urbanek, CNM

## 2017-04-14 NOTE — Progress Notes (Signed)
Patient ID: Dorothy Townsend, female   DOB: 09/04/1990, 27 y.o.   MRN: [id number]  Labor Check  Subj:  Complaints: none, comfortable w epidural   Obj:  BP 111/64   Pulse 94   Temp 98.4 F (36.9 C) (Oral)   Resp 16   LMP 08/03/2016 (Exact Date)   SpO2 99%  Dose (milli-units/min) Oxytocin: 0 milli-units/min  Cervix: Dilation: 6.5 / Effacement (%): 80 / Station: -1, 0   AROM: bloody fluid, no meconium Baseline FHR: 130 beats/min   Variability: moderate   Accelerations: present   Decelerations: absent Contractions: present frequency: 3 q 10 min Overall assessment: category 1 at this time (earlier had been category 2 with pitocin, improved with stopping medication)  A/P: 27 y.o. G1P0000 female at 6957w2d with preterm labor.  1.  Labor: AROM, will slowly add pitocin, if no change  2.  FWB: reassuring, Overall assessment: category 1  3.  GBS unknown - PCN  4.  Pain: epidural working well 5.  Recheck: 2 hours or PRN   Thomasene MohairStephen Jaqualyn Juday, MD, Merlinda FrederickFACOG [facility], [facility] 04/14/2017 9:13 AM

## 2017-04-15 LAB — CBC
HEMATOCRIT: 32.1 % — AB (ref 35.0–47.0)
HEMOGLOBIN: 10.6 g/dL — AB (ref 12.0–16.0)
MCH: 29.9 pg (ref 26.0–34.0)
MCHC: 33.1 g/dL (ref 32.0–36.0)
MCV: 90.3 fL (ref 80.0–100.0)
Platelets: 154 10*3/uL (ref 150–440)
RBC: 3.55 MIL/uL — ABNORMAL LOW (ref 3.80–5.20)
RDW: 12.9 % (ref 11.5–14.5)
WBC: 8.1 10*3/uL (ref 3.6–11.0)

## 2017-04-15 MED ORDER — LORATADINE 10 MG PO TABS
10.0000 mg | ORAL_TABLET | Freq: Every day | ORAL | Status: DC | PRN
  Filled 2017-04-15: qty 1

## 2017-04-15 MED ORDER — DOCUSATE SODIUM 100 MG PO CAPS
100.0000 mg | ORAL_CAPSULE | Freq: Two times a day (BID) | ORAL | Status: DC | PRN
  Administered 2017-04-15: 100 mg via ORAL
  Filled 2017-04-15: qty 1

## 2017-04-15 NOTE — Anesthesia Postprocedure Evaluation (Signed)
Anesthesia Post Note  Patient: Dorothy Townsend  Procedure(s) Performed: AN AD HOC LABOR EPIDURAL  Patient location during evaluation: [facility] Unit Anesthesia Type: Epidural Level of consciousness: awake, awake and alert, oriented and patient cooperative Pain management: pain level controlled Vital Signs Assessment: post-procedure vital signs reviewed and stable Respiratory status: spontaneous breathing, nonlabored ventilation and respiratory function stable Cardiovascular status: stable Postop Assessment: no backache, no headache, epidural receding, patient able to bend at knees, no apparent nausea or vomiting and adequate PO intake Anesthetic complications: no     Last Vitals:  Vitals:   04/14/17 1950 04/15/17 0140  BP: 121/64 110/63  Pulse: 79 82  Resp: 18 18  Temp: 36.8 C 36.7 C  SpO2: 100% 98%    Last Pain:  Vitals:   04/15/17 0140  TempSrc: Oral  PainSc:                  Lyn RecordsNoles,  Brinley Treanor R

## 2017-04-15 NOTE — Lactation Note (Signed)
This note was copied from a baby's chart. Lactation Consultation Note  Patient Name: Dorothy Townsend XBJYN'WToday's Date: 04/15/2017 Reason for consult: Follow-up assessment   Mom was nursing her baby this morning after the 38 blood sugar. She said "it was his best feed yet" and had nursed vigorously with audible swallows for most of the 20 minutes that I observed that feeding. Per Mom's permission, I held Dorothy Townsend to her other breast while Dorothy Townsend was nursing as Mom wanted to try to supplement with her own milk before.  offering Donor milk. I also had her hand express afterwards where she obtained only a few drops. After parents spoke with Dr. Hal Hopearrol, and read/heard info about donor milk, they signed consent form. By the time donor milk was available, Dorothy Townsend was too sleepy to try sns at breast, but alert enough to finger feed. At first, I demonstrated finger feeding and then had Dorothy Townsend try it out. Dorothy Townsend took 10 ml in approx 5-6 minutes.   PLAN: to hand express/pump on one breast and breastfeed on the other per cues, but at least every 3 hours. Supplementing as needed for a 10 ml minimum each time. We may try sns, spoon, syringe/finger feed per patient preference/ability. Dorothy Townsend following blood sugar protocol with BS checks. One hour after this morning's feed, blood sugar elevated to 50. Mom has also been doing skin to skin care. Dorothy Townsend plans to do the same.     Maternal Data Has patient been taught Hand Expression?: Yes  Feeding Feeding Type: Donor Breast Milk Length of feed: 20 min  LATCH Score Latch: Grasps breast easily, tongue down, lips flanged, rhythmical sucking.  Audible Swallowing: Spontaneous and intermittent(almost constant for 20 min. MOm said "best feed")  Type of Nipple: Everted at rest and after stimulation  Comfort (Breast/Nipple): Soft / non-tender  Hold (Positioning): No assistance needed to correctly position infant at breast.  LATCH Score: 10  Interventions Interventions: Skin to  skin;Breast massage;Adjust position;Expressed milk;Dorothy Townsend(needs 10 ml supplement; prep for donor milk next)  Lactation Tools Discussed/Used Tools: Pump(pumped left side while BF on right)   Consult Status Consult Status: Follow-up Date: 04/16/17    Dorothy Townsend 04/15/2017, 12:40 PM

## 2017-04-15 NOTE — Progress Notes (Signed)
PPD#1 SVD Subjective:  Sitting up in bed, holding baby, cheerful and alert. Complains of some drainage and a scratchy feeling throat.  Pain control is good with scheduled medication. Voiding without difficulty. Tolerating a regular diet. Ambulating well.  Objective:   Blood pressure 109/70, pulse 71, temperature 98 F (36.7 C), temperature source Oral, resp. rate 18, last menstrual period 08/03/2016, SpO2 98 %, currently breastfeeding.  General: NAD Pulmonary: no increased work of breathing Abdomen: non-distended, non-tender Uterus:  fundus firm; lochia appropriate Extremities: no edema, no erythema, no tenderness, no signs of DVT  Results for orders placed or performed during the hospital encounter of 04/13/17 (from the past 72 hour(s))  CBC     Status: None   Collection Time: 04/13/17  2:21 PM  Result Value Ref Range   WBC 10.2 3.6 - 11.0 K/uL   RBC 4.03 3.80 - 5.20 MIL/uL   Hemoglobin 12.2 12.0 - 16.0 g/dL   HCT 16.136.0 09.635.0 - 04.547.0 %   MCV 89.4 80.0 - 100.0 fL   MCH 30.3 26.0 - 34.0 pg   MCHC 33.9 32.0 - 36.0 g/dL   RDW 40.913.0 81.111.5 - 91.414.5 %   Platelets 175 150 - 440 K/uL    Comment: Performed at [facility], [address] [place], [place] [address]  Type and screen [facility]     Status: None   Collection Time: 04/13/17  2:21 PM  Result Value Ref Range   ABO/RH(D) B POS    Antibody Screen NEG    Sample Expiration      04/16/2017 Performed at [facility], [address] [place], [place] [address]   RPR     Status: None   Collection Time: 04/13/17  2:21 PM  Result Value Ref Range   RPR Ser Ql Non Reactive Non Reactive    Comment: (NOTE) Performed At: [facility] [address] [place], [place] [id number] Jolene SchimkeNagendra Sanjai MD [phone] Performed at [facility], [address] [place], [place] [address]   CBC     Status: Abnormal   Collection Time: 04/15/17  6:00 AM  Result Value Ref Range   WBC 8.1 3.6  - 11.0 K/uL   RBC 3.55 (L) 3.80 - 5.20 MIL/uL   Hemoglobin 10.6 (L) 12.0 - 16.0 g/dL   HCT 53.632.1 (L) 64.435.0 - 03.447.0 %   MCV 90.3 80.0 - 100.0 fL   MCH 29.9 26.0 - 34.0 pg   MCHC 33.1 32.0 - 36.0 g/dL   RDW 74.212.9 59.511.5 - 63.814.5 %   Platelets 154 150 - 440 K/uL    Comment: Performed at [facility], [address] [place], [place] [address]    Assessment:   27 y.o. G1P0101 postpartum day # 1 recovering well.  Plan:   1) Acute blood loss anemia - hemodynamically stable and asymptomatic - PO ferrous sulfate  2) Blood Type --/--/B POS (04/01 1421)   3) Rubella 10.30 (09/10 1440) / Varicella immune / TDAP status: given 03/02/2017.  4) Breast feeding  5) Contraception - not discussed  6) Disposition: Loratadine ordered for rhinitis. Continue postpartum care. Anticipate discharge tomorrow.  Marcelyn BruinsJacelyn Schmid, CNM 04/15/2017  9:22 AM

## 2017-04-15 NOTE — Lactation Note (Signed)
This note was copied from a baby's chart. Lactation Consultation Note  Patient Name: Boy Dorian HeckleMeredith Borchers ZOXWR'UToday's Date: 04/15/2017 Reason for consult: Follow-up assessment   Parents following feeding plan well. Mom had pumped 15 minutes before breastfeed. [place]on nursed well again skin to skin on right side and then took 8 ml donor milk via syringe. He really didn't want most of it. May consider pre/post weight check as he could be getting plenty of milk now from just breastfeeding.    Maternal Data    Feeding Feeding Type: Donor Breast Milk Length of feed: 20 min  LATCH Score Latch: Grasps breast easily, tongue down, lips flanged, rhythmical sucking.  Audible Swallowing: Spontaneous and intermittent  Type of Nipple: Everted at rest and after stimulation  Comfort (Breast/Nipple): Soft / non-tender  Hold (Positioning): No assistance needed to correctly position infant at breast.(just needed a little tweaking for alignment)  LATCH Score: 10  Interventions Interventions: Assisted with latch;Support pillows;Position options  Lactation Tools Discussed/Used Tools: Pump(pumped left side prior to this feed) Breast pump type: Double-Electric Breast Pump Pump Review: Setup, frequency, and cleaning Initiated by:: Rosie FateSandra Samit Sylve Date initiated:: 04/15/17   Consult Status      Sunday CornSandra Clark Deandrea Rion 04/15/2017, 3:15 PM

## 2017-04-16 LAB — STREP GP B NAA: Strep Gp B NAA: POSITIVE — AB

## 2017-04-16 LAB — GC/CHLAMYDIA PROBE AMP
Chlamydia trachomatis, NAA: NEGATIVE
NEISSERIA GONORRHOEAE BY PCR: NEGATIVE

## 2017-04-16 MED ORDER — IBUPROFEN 600 MG PO TABS: 600.0000 mg | ORAL_TABLET | Freq: Four times a day (QID) | ORAL | 1 refills | Status: DC | PRN

## 2017-04-16 MED ORDER — DOCUSATE SODIUM 100 MG PO CAPS: 100.0000 mg | ORAL_CAPSULE | Freq: Every day | ORAL | 0 refills | Status: DC | PRN

## 2017-04-16 MED ORDER — ACETAMINOPHEN 325 MG PO TABS: 650.0000 mg | ORAL_TABLET | ORAL | 0 refills | Status: DC | PRN

## 2017-04-16 NOTE — Progress Notes (Signed)
Reviewed all patients discharge instructions and handouts regarding postpartum bleeding, no intercourse for 6 weeks, signs and symptoms of mastitis and postpartum bleu's. Reviewed discharge instructions for newborn regarding proper cord care, how and when to bathe the newborn, nail care, along with safe sleep. All questions have been answered at this time. Patient discharge completed, pt instructed to go get prescriptions for medications, patient aware of discharge. Patient to room in with baby.

## 2017-04-16 NOTE — Progress Notes (Signed)
   04/16/17 1500  Clinical Encounter Type  Visited With Patient not available  Visit Type Initial  Referral From Nurse  Consult/Referral To Chaplain   While rounding on unit, staff recommended chaplain check in on patient as she had been tearful today.  Patient not in room, but in nursery with child.  Staff called in to nursery to see if patient would like chaplain to come in to visit.  Family declined at present.

## 2017-04-17 ENCOUNTER — Ambulatory Visit: Payer: Self-pay

## 2017-04-17 NOTE — Lactation Note (Signed)
This note was copied from a baby's chart. Lactation Consultation Note  Patient Name: Dorothy Townsend ZOXWR'UToday's Date: 04/17/2017 Reason for consult: Follow-up assessment;NICU baby;Late-preterm 34-36.6wks Because of firmness of mom's breast and nipples flattening with compression, nipple shield was initiated, baby was able to latch deeper with gentle pressure on lower jaw, many swallows and satisfied sleepy baby after feeding , colostrum in shield after baby off breast   Maternal Data Formula Feeding for Exclusion: No Has patient been taught Hand Expression?: Yes Does the patient have breastfeeding experience prior to this delivery?: No  Feeding Feeding Type: Breast Fed Nipple Type: Slow - flow Length of feed: 35 min  LATCH Score Latch: Grasps breast easily, tongue down, lips flanged, rhythmical sucking.(with shield)  Audible Swallowing: Spontaneous and intermittent  Type of Nipple: Flat(because of engorgement)  Comfort (Breast/Nipple): Engorged, cracked, bleeding, large blisters, severe discomfort  Hold (Positioning): Assistance needed to correctly position infant at breast and maintain latch.  LATCH Score: 6  Interventions Interventions: Assisted with latch;Breast massage;Pre-pump if needed;Breast compression;Adjust position;Coconut oil;DEBP  Lactation Tools Discussed/Used Tools: Nipple Shields Nipple shield size: 20 Breast pump type: Double-Electric Breast Pump WIC Program: No [place] nipple shield instruction sheet given  Consult Status Consult Status: Complete    Dyann KiefMarsha D Tarius Stangelo 04/17/2017, 2:42 PM

## 2017-04-20 ENCOUNTER — Encounter: Payer: BLUE CROSS/BLUE SHIELD | Admitting: Obstetrics and Gynecology

## 2017-04-27 ENCOUNTER — Encounter: Payer: BLUE CROSS/BLUE SHIELD | Admitting: Obstetrics and Gynecology

## 2017-05-04 ENCOUNTER — Ambulatory Visit (INDEPENDENT_AMBULATORY_CARE_PROVIDER_SITE_OTHER): Payer: BLUE CROSS/BLUE SHIELD | Admitting: Obstetrics and Gynecology

## 2017-05-04 ENCOUNTER — Encounter: Payer: Self-pay | Admitting: Obstetrics and Gynecology

## 2017-05-04 ENCOUNTER — Encounter: Payer: BLUE CROSS/BLUE SHIELD | Admitting: Obstetrics and Gynecology

## 2017-05-04 DIAGNOSIS — F411 Generalized anxiety disorder: Secondary | ICD-10-CM

## 2017-05-04 NOTE — Progress Notes (Signed)
Obstetrics & Gynecology Office Visit   Chief Complaint  Patient presents with  . Follow-up    2 week post partum f/u    History of Present Illness: 27 y.o. G15P0101 female who is 3 weeks postpartum from a preterm spontaneous vaginal delivery. She has a history of anxiety and depression (specifically anxiety).  She is here for a routine screening exam to assess for postpartum depression. She denies any significant symptoms of anxiety or depression. She has no acute concerns today. EPDS: 4   Past Medical History:  Diagnosis Date  . Acne   . ADD (attention deficit disorder)   . ADHD (attention deficit hyperactivity disorder)    history of  . Anxiety    history of  . Constipation   . Immunization, viral disease    GARDASIL COMPLETED  . Personal history of kidney stones     Past Surgical History:  Procedure Laterality Date  . EXCISION OF KELOID    . EXTERNAL EAR SURGERY Bilateral 2012   NODULES REMOVED   . TONSILLECTOMY  2015  . WISDOM TOOTH EXTRACTION      Gynecologic History: No LMP recorded.  Obstetric History: G1P0101  Family History  Problem Relation Age of Onset  . Diabetes Maternal Grandfather   . Cancer Other        UNKNOWN AGE/TYPE-THINKS THROAT    Social History   Socioeconomic History  . Marital status: Married    Spouse name: Not on file  . Number of children: 0  . Years of education: 59  . Highest education level: Not on file  Occupational History  . Occupation: Public librarian    Comment: [employer]  Social Needs  . Financial resource strain: Not on file  . Food insecurity:    Worry: Not on file    Inability: Not on file  . Transportation needs:    Medical: Not on file    Non-medical: Not on file  Tobacco Use  . Smoking status: Never Smoker  . Smokeless tobacco: Never Used  Substance and Sexual Activity  . Alcohol use: Yes    Alcohol/week: 0.0 oz    Comment: seldom  . Drug use: No  . Sexual activity: Yes    Partners: Male    Birth  control/protection: None  Lifestyle  . Physical activity:    Days per week: Not on file    Minutes per session: Not on file  . Stress: Not on file  Relationships  . Social connections:    Talks on phone: Not on file    Gets together: Not on file    Attends religious service: Not on file    Active member of club or organization: Not on file    Attends meetings of clubs or organizations: Not on file    Relationship status: Not on file  . Intimate partner violence:    Fear of current or ex partner: Not on file    Emotionally abused: Not on file    Physically abused: Not on file    Forced sexual activity: Not on file  Other Topics Concern  . Not on file  Social History Narrative  . Not on file    Allergies  Allergen Reactions  . Fluvoxamine Anxiety    Prior to Admission medications   Medication Sig Start Date End Date Taking? Authorizing Provider  acetaminophen (TYLENOL) 325 MG tablet Take 2 tablets (650 mg total) by mouth every 4 (four) hours as needed for mild pain or moderate pain. 04/16/17  Farrel ConnersGutierrez, Colleen, CNM  docusate sodium (COLACE) 100 MG capsule Take 1 capsule (100 mg total) by mouth daily as needed for mild constipation. 04/16/17   Farrel ConnersGutierrez, Colleen, CNM  ibuprofen (ADVIL,MOTRIN) 600 MG tablet Take 1 tablet (600 mg total) by mouth every 6 (six) hours as needed for mild pain, moderate pain or cramping. 04/16/17   Farrel ConnersGutierrez, Colleen, CNM  Prenatal Vit-Fe Fumarate-FA (PRENATAL VITAMIN PO) Take by mouth.    [provider]  pseudoephedrine (SUDAFED) 30 MG/5ML syrup Take by mouth 4 (four) times daily as needed for congestion.    [provider]    Review of Systems  Constitutional: Negative.   HENT: Negative.   Eyes: Negative.   Respiratory: Negative.   Cardiovascular: Negative.   Gastrointestinal: Negative.   Genitourinary: Negative.   Musculoskeletal: Negative.   Skin: Negative.   Neurological: Negative.   Psychiatric/Behavioral: Negative.       Physical Exam BP 114/70   Ht 5\' 1"  (1.549 m)   Wt 148 lb (67.1 kg)   BMI 27.96 kg/m  No LMP recorded. Physical Exam  Constitutional: She is oriented to person, place, and time. She appears well-developed and well-nourished. No distress.  HENT:  Head: Normocephalic and atraumatic.  Eyes: Conjunctivae are normal. No scleral icterus.  Cardiovascular: Normal rate and regular rhythm.  Pulmonary/Chest: Effort normal. No respiratory distress.  Neurological: She is oriented to person, place, and time. No cranial nerve deficit.  Psychiatric: She has a normal mood and affect. Her behavior is normal. Judgment normal.    Edinburgh Postnatal Depression Scale - 05/04/17 1438      Edinburgh Postnatal Depression Scale:  In the Past 7 Days   I have been able to laugh and see the funny side of things.  0    I have looked forward with enjoyment to things.  0    I have blamed myself unnecessarily when things went wrong.  2    I have been anxious or worried for no good reason.  0    I have felt scared or panicky for no good reason.  1    Things have been getting on top of me.  1    I have been so unhappy that I have had difficulty sleeping.  0    I have felt sad or miserable.  0    I have been so unhappy that I have been crying.  0    The thought of harming myself has occurred to me.  0    Edinburgh Postnatal Depression Scale Total  4       Assessment: 27 y.o. 441P0101 female here for  1. Postpartum care following vaginal delivery   2. GAD (generalized anxiety disorder)      Plan: Problem List Items Addressed This Visit      Other   GAD (generalized anxiety disorder)   Postpartum care following vaginal delivery - Primary     Patient doing well with no concerning findings at this time.  Will continue to monitor. Otherwise, keep 6 week postpartum appointment.   Thomasene MohairStephen Jackson, MD 05/04/2017 9:47 AM

## 2017-05-28 ENCOUNTER — Ambulatory Visit (INDEPENDENT_AMBULATORY_CARE_PROVIDER_SITE_OTHER): Payer: BLUE CROSS/BLUE SHIELD | Admitting: Obstetrics and Gynecology

## 2017-05-28 ENCOUNTER — Encounter: Payer: Self-pay | Admitting: Obstetrics and Gynecology

## 2017-05-28 NOTE — Progress Notes (Signed)
Postpartum Visit   Chief Complaint  Patient presents with  . Postpartum Care    6 wk pp   History of Present Illness: Patient is a 27 y.o. G1P0101 presents for postpartum visit.  Date of delivery: 04/14/17 Type of delivery: Vaginal delivery - Vacuum or forceps assisted  no Episiotomy No.  Laceration: yes, 1st degree vaginal Pregnancy or labor problems:  Yes, preterm labor and delivery at 36 weeks Any problems since the delivery:  yes  Newborn Details:  SINGLETON :  1. Baby's name: Madaline Guthrie. Birth weight: 2,500 grams Maternal Details:  Breast Feeding:  yes Post partum depression/anxiety noted:  no Edinburgh Post-Partum Depression Score:  1  Date of last PAP: 11/2015  normal   Breast issues: some engorgement on right side.  No symptoms  Vaginal Bleeding: not having any Baby: is doing well  Past Medical History:  Diagnosis Date  . Acne   . ADD (attention deficit disorder)   . ADHD (attention deficit hyperactivity disorder)    history of  . Anxiety    history of  . Constipation   . Immunization, viral disease    GARDASIL COMPLETED  . Personal history of kidney stones     Past Surgical History:  Procedure Laterality Date  . EXCISION OF KELOID    . EXTERNAL EAR SURGERY Bilateral 2012   NODULES REMOVED   . TONSILLECTOMY  2015  . WISDOM TOOTH EXTRACTION      Prior to Admission medications   Medication Sig Start Date End Date Taking? Authorizing Provider  Prenatal Vit-Fe Fumarate-FA (PRENATAL VITAMIN PO) Take by mouth.   Yes [provider]    Allergies  Allergen Reactions  . Fluvoxamine Anxiety     Social History   Socioeconomic History  . Marital status: Married    Spouse name: Not on file  . Number of children: 0  . Years of education: 18  . Highest education level: Not on file  Occupational History  . Occupation: Public librarian    Comment: [employer]  Social Needs  . Financial resource strain: Not on file  . Food insecurity:    Worry: Not on  file    Inability: Not on file  . Transportation needs:    Medical: Not on file    Non-medical: Not on file  Tobacco Use  . Smoking status: Never Smoker  . Smokeless tobacco: Never Used  Substance and Sexual Activity  . Alcohol use: Yes    Alcohol/week: 0.0 oz    Comment: seldom  . Drug use: No  . Sexual activity: Not Currently    Partners: Male    Birth control/protection: None  Lifestyle  . Physical activity:    Days per week: Not on file    Minutes per session: Not on file  . Stress: Not on file  Relationships  . Social connections:    Talks on phone: Not on file    Gets together: Not on file    Attends religious service: Not on file    Active member of club or organization: Not on file    Attends meetings of clubs or organizations: Not on file    Relationship status: Not on file  . Intimate partner violence:    Fear of current or ex partner: Not on file    Emotionally abused: Not on file    Physically abused: Not on file    Forced sexual activity: Not on file  Other Topics Concern  . Not on file  Social  History Narrative  . Not on file    Family History  Problem Relation Age of Onset  . Diabetes Maternal Grandfather   . Cancer Other        UNKNOWN AGE/TYPE-THINKS THROAT    Review of Systems  Constitutional: Negative.   HENT: Negative.   Eyes: Negative.   Respiratory: Negative.   Cardiovascular: Negative.   Gastrointestinal: Negative.   Genitourinary: Negative.   Musculoskeletal: Negative.   Skin: Negative.   Neurological: Negative.   Psychiatric/Behavioral: Negative.      Physical Exam BP 104/64   Ht  (1.549 m)   Wt 145 lb (65.8 kg)   Breastfeeding? Yes   BMI 27.40 kg/m   Physical Exam  Constitutional: She is oriented to person, place, and time. She appears well-developed and well-nourished. No distress.  Genitourinary: Vagina normal and uterus normal. Pelvic exam was performed with patient supine. There is no rash, tenderness or lesion  on the right labia. There is no rash, tenderness or lesion on the left labia. Right adnexum does not display mass, does not display tenderness and does not display fullness. Left adnexum does not display mass, does not display tenderness and does not display fullness. Cervix does not exhibit motion tenderness, lesion or polyp.   Uterus is mobile and anteverted. Uterus is not enlarged, tender, exhibiting a mass or irregular (is regular).  Eyes: EOM are normal. No scleral icterus.  Neck: Normal range of motion. Neck supple.  Cardiovascular: Normal rate and regular rhythm.  Pulmonary/Chest: Effort normal and breath sounds normal. No respiratory distress. She has no wheezes. She has no rales.  Abdominal: Soft. Bowel sounds are normal. She exhibits no distension and no mass. There is no tenderness. There is no rebound and no guarding.  Musculoskeletal: Normal range of motion. She exhibits no edema.  Neurological: She is alert and oriented to person, place, and time. No cranial nerve deficit.  Skin: Skin is warm and dry. No erythema.  Psychiatric: She has a normal mood and affect. Her behavior is normal. Judgment normal.     Female Chaperone present during breast and/or pelvic exam.  Assessment: 27 y.o. G1P0101 presenting for 6 week postpartum visit  Plan: Problem List Items Addressed This Visit    None    Visit Diagnoses    Postpartum care and examination    -  Primary     1) Contraception Education given regarding options for contraception, including barrier methods, injectable contraception, IUD placement, oral contraceptives. Undecided as to what she would like to do.   2)  Pap - ASCCP guidelines and rational discussed.  Patient opts for routine screening interval  3) Patient underwent screening for postpartum depression with no concerns noted.  4) Follow up 1 year for routine annual exam  [name], MD 05/28/2017 10:56 AM

## 2017-11-04 ENCOUNTER — Encounter: Payer: Self-pay | Admitting: Obstetrics and Gynecology

## 2017-11-04 ENCOUNTER — Ambulatory Visit (INDEPENDENT_AMBULATORY_CARE_PROVIDER_SITE_OTHER): Payer: BLUE CROSS/BLUE SHIELD | Admitting: Obstetrics and Gynecology

## 2017-11-04 VITALS — BP 118/74 | Ht 61.0 in | Wt 130.0 lb

## 2017-11-04 DIAGNOSIS — N61 Mastitis without abscess: Secondary | ICD-10-CM | POA: Diagnosis not present

## 2017-11-04 MED ORDER — AMOXICILLIN-POT CLAVULANATE 875-125 MG PO TABS: 1.0000 | ORAL_TABLET | Freq: Two times a day (BID) | ORAL | 0 refills | Status: AC

## 2017-11-04 NOTE — Progress Notes (Signed)
Obstetrics & Gynecology Office Visit   Chief Complaint  Patient presents with  . Breast Pain   History of Present Illness: 27 y.o. G1P0101 who presents for evaluation of breast issues.  Her right breast has had a knot for the past week.  She has pain, which is throbbing and it radiates into her axilla.  The pain is a 7/10.  The pain comes and goes.  Nothing makes the pain better. Nothing makes worse.  She feels like her chest and face have been flush. She does not think she has a fever.  She is still stimulating her breasts for milk production.  She has had no other issues with her breast since her delivery.  The pain has been worse over the past two days.   Past Medical History:  Diagnosis Date  . Acne   . ADD (attention deficit disorder)   . ADHD (attention deficit hyperactivity disorder)    history of  . Anxiety    history of  . Constipation   . Immunization, viral disease    GARDASIL COMPLETED  . Personal history of kidney stones     Past Surgical History:  Procedure Laterality Date  . EXCISION OF KELOID    . EXTERNAL EAR SURGERY Bilateral 2012   NODULES REMOVED   . TONSILLECTOMY  2015  . WISDOM TOOTH EXTRACTION      Gynecologic History: Patient's last menstrual period was 10/12/2017.  Obstetric History: G1P0101  Family History  Problem Relation Age of Onset  . Diabetes Maternal Grandfather   . Cancer Other        UNKNOWN AGE/TYPE-THINKS THROAT    Social History   Socioeconomic History  . Marital status: Married    Spouse name: Not on file  . Number of children: 0  . Years of education: 55  . Highest education level: Not on file  Occupational History  . Occupation: Public librarian    Comment: [employer]  Social Needs  . Financial resource strain: Not on file  . Food insecurity:    Worry: Not on file    Inability: Not on file  . Transportation needs:    Medical: Not on file    Non-medical: Not on file  Tobacco Use  . Smoking status: Never Smoker  .  Smokeless tobacco: Never Used  Substance and Sexual Activity  . Alcohol use: Yes    Alcohol/week: 0.0 standard drinks    Comment: seldom  . Drug use: No  . Sexual activity: Not Currently    Partners: Male    Birth control/protection: None  Lifestyle  . Physical activity:    Days per week: Not on file    Minutes per session: Not on file  . Stress: Not on file  Relationships  . Social connections:    Talks on phone: Not on file    Gets together: Not on file    Attends religious service: Not on file    Active member of club or organization: Not on file    Attends meetings of clubs or organizations: Not on file    Relationship status: Not on file  . Intimate partner violence:    Fear of current or ex partner: Not on file    Emotionally abused: Not on file    Physically abused: Not on file    Forced sexual activity: Not on file  Other Topics Concern  . Not on file  Social History Narrative  . Not on file    Allergies  Allergen  Reactions  . Fluvoxamine Anxiety    Prior to Admission medications   Medication Sig Start Date End Date Taking? Authorizing Provider  Prenatal Vit-Fe Fumarate-FA (PRENATAL VITAMIN PO) Take by mouth.    [provider]    Review of Systems  Constitutional: Negative.  Fever: temp to 100.0 F at home today.  HENT: Negative.   Eyes: Negative.   Respiratory: Negative.   Cardiovascular: Negative.   Gastrointestinal: Negative.   Genitourinary: Negative.   Musculoskeletal: Negative.   Skin: Negative.        See HPI for breast issues  Neurological: Negative.   Psychiatric/Behavioral: Negative.      Physical Exam BP 118/74   Ht 5\' 1"  (1.549 m)   Wt 130 lb (59 kg)   LMP 10/12/2017   BMI 24.56 kg/m  Patient's last menstrual period was 10/12/2017. Physical Exam  Constitutional: She is oriented to person, place, and time. She appears well-developed and well-nourished. No distress.  HENT:  Head: Normocephalic and atraumatic.  Eyes:  Conjunctivae are normal. No scleral icterus.  Cardiovascular: Normal rate and regular rhythm.  Pulmonary/Chest: Effort normal and breath sounds normal. No respiratory distress. Right breast exhibits tenderness. Right breast exhibits no mass, no nipple discharge and no skin change. Left breast exhibits no mass, no nipple discharge, no skin change and no tenderness.    Musculoskeletal: Normal range of motion. She exhibits no edema.  Neurological: She is alert and oriented to person, place, and time. No cranial nerve deficit.  Skin: Skin is warm and dry. No erythema.  Psychiatric: She has a normal mood and affect. Her behavior is normal. Judgment normal.    Female chaperone present for pelvic and breast  portions of the physical exam  Assessment: 27 y.o. G57P0101 female here for  1. Mastitis, acute      Plan: Problem List Items Addressed This Visit    None    Visit Diagnoses    Mastitis, acute    -  Primary   Relevant Medications   amoxicillin-clavulanate (AUGMENTIN) 875-125 MG tablet     There is a noticeable difference in her breast tissue along with a notable sensitivity in the area.  We will treat presumptively for mastitis.  If no improvement, will obtain ultrasound of her breast.  Augmentin prescribed for acute mastitis.  20 minutes spent in face to face discussion with > 50% spent in counseling,management, and coordination of care of her acute mastitis.   [name], MD 11/04/2017 6:57 PM

## 2018-08-05 LAB — FETAL NONSTRESS TEST

## 2018-08-17 LAB — FETAL NONSTRESS TEST

## 2018-09-24 ENCOUNTER — Telehealth: Payer: Self-pay

## 2018-09-28 NOTE — Telephone Encounter (Signed)
Encounter error

## 2018-11-08 ENCOUNTER — Ambulatory Visit: Payer: BLUE CROSS/BLUE SHIELD | Admitting: Obstetrics and Gynecology

## 2018-11-29 ENCOUNTER — Ambulatory Visit: Payer: Self-pay | Admitting: Physician Assistant

## 2018-12-01 ENCOUNTER — Other Ambulatory Visit (HOSPITAL_COMMUNITY): Admit: 2018-12-01 | Payer: BLUE CROSS/BLUE SHIELD

## 2018-12-01 ENCOUNTER — Other Ambulatory Visit: Payer: Self-pay

## 2018-12-01 ENCOUNTER — Ambulatory Visit (INDEPENDENT_AMBULATORY_CARE_PROVIDER_SITE_OTHER): Payer: BLUE CROSS/BLUE SHIELD | Admitting: Obstetrics and Gynecology

## 2018-12-01 ENCOUNTER — Encounter: Payer: Self-pay | Admitting: Obstetrics and Gynecology

## 2018-12-01 VITALS — BP 124/70 | Ht 61.0 in | Wt 128.0 lb

## 2018-12-01 DIAGNOSIS — Z124 Encounter for screening for malignant neoplasm of cervix: Secondary | ICD-10-CM

## 2018-12-01 DIAGNOSIS — Z1331 Encounter for screening for depression: Secondary | ICD-10-CM

## 2018-12-01 DIAGNOSIS — Z01419 Encounter for gynecological examination (general) (routine) without abnormal findings: Secondary | ICD-10-CM

## 2018-12-01 DIAGNOSIS — Z1339 Encounter for screening examination for other mental health and behavioral disorders: Secondary | ICD-10-CM

## 2018-12-01 DIAGNOSIS — Z23 Encounter for immunization: Secondary | ICD-10-CM | POA: Diagnosis not present

## 2018-12-01 LAB — HM PAP SMEAR: HM Pap smear: NEGATIVE

## 2018-12-01 NOTE — Progress Notes (Signed)
Gynecology Annual Exam   PCP: Bobetta Lime, MD  Chief Complaint  Patient presents with  . Gynecologic Exam    Wants to talk about conceiving again    History of Present Illness:  [name] is a 28 y.o. G1P0101 who LMP was Patient's last menstrual period was 11/07/2018 (exact date)., presents today for her annual examination.  Her menses are regular every 28-30 days, lasting 4 day(s).  Dysmenorrhea mild, occurring premenstrually. She does not have intermenstrual bleeding. Just before her most recent period she had some light pink discharge.   She is sexually active, she uses condoms for contraception.  Last Pap: 3 years  Results were: no abnormalities /neg HPV DNA not done due to age Hx of STDs: none  There is no FH of breast cancer. There is no FH of ovarian cancer. The patient does not do self-breast exams.  Tobacco use: The patient denies current or previous tobacco use. Alcohol use: social drinker Exercise: very active  The patient wears seatbelts: yes.   The patient reports that domestic violence in her life is absent.   Past Medical History:  Diagnosis Date  . Acne   . ADD (attention deficit disorder)   . ADHD (attention deficit hyperactivity disorder)    history of  . Anxiety    history of  . Constipation   . Immunization, viral disease    GARDASIL COMPLETED  . Personal history of kidney stones     Past Surgical History:  Procedure Laterality Date  . EXCISION OF KELOID    . EXTERNAL EAR SURGERY Bilateral 2012   NODULES REMOVED   . TONSILLECTOMY  2015  . WISDOM TOOTH EXTRACTION      Prior to Admission medications   Medication Sig Start Date End Date Taking? Authorizing Provider  Prenatal Vit-Fe Fumarate-FA (PRENATAL VITAMIN PO) Take by mouth.   Yes [provider]    Allergies  Allergen Reactions  . Fluvoxamine Anxiety   Obstetric History: G1P0101, s/p SVD at 36 weeks after preterm labor  Social History   Socioeconomic  History  . Marital status: Married    Spouse name: Not on file  . Number of children: 0  . Years of education: 22  . Highest education level: Not on file  Occupational History  . Occupation: Careers adviser    Comment: [employer]  Social Needs  . Financial resource strain: Not on file  . Food insecurity    Worry: Not on file    Inability: Not on file  . Transportation needs    Medical: Not on file    Non-medical: Not on file  Tobacco Use  . Smoking status: Never Smoker  . Smokeless tobacco: Never Used  Substance and Sexual Activity  . Alcohol use: Yes    Alcohol/week: 0.0 standard drinks    Comment: seldom  . Drug use: No  . Sexual activity: Yes    Partners: Male    Birth control/protection: None  Lifestyle  . Physical activity    Days per week: Not on file    Minutes per session: Not on file  . Stress: Not on file  Relationships  . Social Herbalist on phone: Not on file    Gets together: Not on file    Attends religious service: Not on file    Active member of club or organization: Not on file    Attends meetings of clubs or organizations: Not on file    Relationship  status: Not on file  . Intimate partner violence    Fear of current or ex partner: Not on file    Emotionally abused: Not on file    Physically abused: Not on file    Forced sexual activity: Not on file  Other Topics Concern  . Not on file  Social History Narrative  . Not on file    Family History  Problem Relation Age of Onset  . Diabetes Maternal Grandfather   . Cancer Other        UNKNOWN AGE/TYPE-THINKS THROAT    Review of Systems  Constitutional: Negative.   HENT: Negative.   Eyes: Negative.   Respiratory: Negative.   Cardiovascular: Negative.   Gastrointestinal: Negative.   Genitourinary: Negative.   Musculoskeletal: Negative.   Skin: Negative.   Neurological: Negative.   Psychiatric/Behavioral: Negative.      Physical Exam BP 124/70   Ht 5\' 1"  (1.549 m)   Wt 128 lb  (58.1 kg)   LMP 11/07/2018 (Exact Date)   Breastfeeding No   BMI 24.19 kg/m    Physical Exam Constitutional:      General: She is not in acute distress.    Appearance: Normal appearance. She is well-developed.  Genitourinary:     Pelvic exam was performed with patient supine.     Vulva, urethra, bladder and uterus normal.     No inguinal adenopathy present in the right or left side.    No signs of injury in the vagina.     No vaginal discharge, erythema, tenderness or bleeding.     No cervical motion tenderness, discharge, lesion or polyp.     Uterus is mobile.     Uterus is not enlarged or tender.     No uterine mass detected.    Uterus is anteverted.     No right or left adnexal mass present.     Right adnexa not tender or full.     Left adnexa not tender or full.  HENT:     Head: Normocephalic and atraumatic.  Eyes:     General: No scleral icterus.    Conjunctiva/sclera: Conjunctivae normal.  Neck:     Musculoskeletal: Normal range of motion and neck supple.     Thyroid: No thyromegaly.  Cardiovascular:     Rate and Rhythm: Normal rate and regular rhythm.     Heart sounds: No murmur. No friction rub. No gallop.   Pulmonary:     Effort: Pulmonary effort is normal. No respiratory distress.     Breath sounds: Normal breath sounds. No wheezing or rales.  Chest:     Breasts:        Right: No inverted nipple, mass, nipple discharge, skin change or tenderness.        Left: No inverted nipple, mass, nipple discharge, skin change or tenderness.  Abdominal:     General: Bowel sounds are normal. There is no distension.     Palpations: Abdomen is soft. There is no mass.     Tenderness: There is no abdominal tenderness. There is no guarding or rebound.  Musculoskeletal: Normal range of motion.        General: No swelling or tenderness.  Lymphadenopathy:     Cervical: No cervical adenopathy.     Lower Body: No right inguinal adenopathy. No left inguinal adenopathy.   Neurological:     General: No focal deficit present.     Mental Status: She is alert and oriented to person, place, and time.  Cranial Nerves: No cranial nerve deficit.  Skin:    General: Skin is warm and dry.     Findings: No erythema or rash.  Psychiatric:        Mood and Affect: Mood normal.        Behavior: Behavior normal.        Judgment: Judgment normal.   Female chaperone present for pelvic and breast  portions of the physical exam  Results: AUDIT Questionnaire (screen for alcoholism): 3 PHQ-9: 2  Assessment: 28 y.o. G30P0101 female here for routine annual gynecologic examination  Plan: Problem List Items Addressed This Visit    None    Visit Diagnoses    Women's annual routine gynecological examination    -  Primary   Relevant Orders   Cytology - PAP   Screening for depression       Screening for alcoholism       Pap smear for cervical cancer screening       Relevant Orders   Cytology - PAP      Screening: -- Blood pressure screen normal -- Weight screening: normal -- Depression screening negative (PHQ-9) -- Nutrition: normal -- cholesterol screening: not due for screening -- osteoporosis screening: not due -- tobacco screening: not using -- alcohol screening: AUDIT questionnaire indicates low-risk usage. -- family history of breast cancer screening: done. not at high risk. -- no evidence of domestic violence or intimate partner violence. -- STD screening: gonorrhea/chlamydia NAAT not collected per patient request. -- pap smear collected per ASCCP guidelines -- flu vaccine would like to receive -- HPV vaccination series: received  [name], MD 12/01/2018 10:06 AM

## 2018-12-03 LAB — CYTOLOGY - PAP: Diagnosis: NEGATIVE

## 2019-01-14 NOTE — L&D Delivery Note (Signed)
Delivery Note At 2:43 AM a viable female infant was delivered via Vaginal, Spontaneous (Presentation: Right Occiput Anterior).  APGAR: 8, 9; weight pending.   Placenta status: Spontaneous, Intact.  Cord: 3 vessels with the following complications:  None.  Cord pH: n/a  Anesthesia: Epidural Episiotomy: None Lacerations: None Suture Repair: n/a Est. Blood Loss (mL): 300  Mom to postpartum.  Baby to Couplet care / Skin to Skin.  Called to see patient.  Mom pushed to deliver a viable female infant.  The head followed by shoulders, which delivered without difficulty, and the rest of the body.  No nuchal cord noted.  Baby to mom's chest.  Cord clamped and cut after > 1 min delay.  No cord blood obtained.  Placenta delivered spontaneously, intact, with a 3-vessel cord.  No vaginal, cervical, or perineal lacerations. All counts correct.  Hemostasis obtained with IV pitocin and fundal massage. EBL 300 mL.     [name], MD 08/26/2019, 3:05 AM

## 2019-01-28 ENCOUNTER — Other Ambulatory Visit: Payer: Self-pay

## 2019-01-28 ENCOUNTER — Ambulatory Visit (INDEPENDENT_AMBULATORY_CARE_PROVIDER_SITE_OTHER): Payer: 59 | Admitting: Obstetrics and Gynecology

## 2019-01-28 ENCOUNTER — Other Ambulatory Visit (HOSPITAL_COMMUNITY): Admission: RE | Admit: 2019-01-28 | Payer: BLUE CROSS/BLUE SHIELD | Admitting: Obstetrics and Gynecology

## 2019-01-28 ENCOUNTER — Encounter: Payer: Self-pay | Admitting: Obstetrics and Gynecology

## 2019-01-28 VITALS — BP 122/74 | Wt 131.0 lb

## 2019-01-28 DIAGNOSIS — Z3A01 Less than 8 weeks gestation of pregnancy: Secondary | ICD-10-CM

## 2019-01-28 DIAGNOSIS — K5909 Other constipation: Secondary | ICD-10-CM

## 2019-01-28 DIAGNOSIS — O0990 Supervision of high risk pregnancy, unspecified, unspecified trimester: Secondary | ICD-10-CM | POA: Insufficient documentation

## 2019-01-28 DIAGNOSIS — O0991 Supervision of high risk pregnancy, unspecified, first trimester: Secondary | ICD-10-CM

## 2019-01-28 DIAGNOSIS — O09899 Supervision of other high risk pregnancies, unspecified trimester: Secondary | ICD-10-CM | POA: Insufficient documentation

## 2019-01-28 DIAGNOSIS — K5900 Constipation, unspecified: Secondary | ICD-10-CM | POA: Insufficient documentation

## 2019-01-28 LAB — OB RESULTS CONSOLE VARICELLA ZOSTER ANTIBODY, IGG: Varicella: IMMUNE

## 2019-01-28 NOTE — Progress Notes (Signed)
New Obstetric Patient H&P   Chief Complaint: "Desires prenatal care"   History of Present Illness: Patient is a 29 y.o. G2P0101 Not Hispanic or Latino female, sure LMP 12/09/2018 presents with amenorrhea and positive home pregnancy test. Based on her  LMP, her EDD is Estimated Date of Delivery: 09/15/19 and her EGA is [redacted]w[redacted]d. Cycles are 5. and 28. days, regular, and occur approximately every : 28 days. Her last pap smear was 3 months ago and was normal.    She had a urine pregnancy test which was positive 3 or 4 week(s)  ago. Her last menstrual period was a little late (about 2 days) and lasted for  2 day(s). Since her LMP she claims she has experienced some cramping. She denies vaginal bleeding. Her past medical history is noncontributory. Her prior pregnancies are notable for preterm delivery at 70 weeks.  Since her LMP, she admits to the use of tobacco products  no She claims she has gained zero pounds since the start of her pregnancy.  There are cats in the home in the home  no  She admits close contact with children on a regular basis  yes  She has had chicken pox in the past yes She has had Tuberculosis exposures, symptoms, or previously tested positive for TB   no Current or past history of domestic violence. no  Genetic Screening/Teratology Counseling: (Includes patient, baby's father, or anyone in either family with:)   15. Patient's age >/= 63 [facility]  no 2. Thalassemia ([place], [place], [place], or Asian background): MCV<80  no 3. Neural tube defect (meningomyelocele, spina bifida, anencephaly)  no 4. Congenital heart defect  no  5. Down syndrome  no 6. Tay-Sachs (Jewish, [place])  no 7. Canavan's Disease  no 8. Sickle cell disease or trait (African)  no  9. Hemophilia or other blood disorders  no  10. Muscular dystrophy  no  11. Cystic fibrosis  no  12. Huntington's Chorea  no  13. Mental retardation/autism  no 14. Other inherited genetic or chromosomal disorder   no 15. Maternal metabolic disorder (DM, PKU, etc)  no 16. Patient or FOB with a child with a birth defect not listed above no  16a. Patient or FOB with a birth defect themselves no 17. Recurrent pregnancy loss, or stillbirth  no  18. Any medications since LMP other than prenatal vitamins (include vitamins, supplements, OTC meds, drugs, alcohol)  no 19. Any other genetic/environmental exposure to discuss  no  Infection History:   1. Lives with someone with TB or TB exposed  no  2. Patient or partner has history of genital herpes  no 3. Rash or viral illness since LMP  no 4. History of STI (GC, CT, HPV, syphilis, HIV)  no 5. History of recent travel :  no  Other pertinent information:  no   Review of Systems:10 point review of systems negative unless otherwise noted in HPI  Past Medical History:  Diagnosis Date  . Acne   . ADD (attention deficit disorder)   . ADHD (attention deficit hyperactivity disorder)    history of  . Anxiety    history of  . Constipation   . Immunization, viral disease    GARDASIL COMPLETED  . Personal history of kidney stones     Past Surgical History:  Procedure Laterality Date  . EXCISION OF KELOID    . EXTERNAL EAR SURGERY Bilateral 2012   NODULES REMOVED   . TONSILLECTOMY  2015  . WISDOM  TOOTH EXTRACTION      Gynecologic History: Patient's last menstrual period was 12/09/2018.  Obstetric History: G2P0101, s/p SVD at 59 weeks with G1  Family History  Problem Relation Age of Onset  . Diabetes Maternal Grandfather   . Cancer Other        UNKNOWN AGE/TYPE-THINKS THROAT    Social History   Socioeconomic History  . Marital status: Married    Spouse name: Not on file  . Number of children: 0  . Years of education: 47  . Highest education level: Not on file  Occupational History  . Occupation: Stylist    Comment: [employer]  Tobacco Use  . Smoking status: Never Smoker  . Smokeless tobacco: Never Used  Substance and Sexual  Activity  . Alcohol use: Yes    Alcohol/week: 0.0 standard drinks    Comment: seldom  . Drug use: No  . Sexual activity: Yes    Partners: Male    Birth control/protection: None  Other Topics Concern  . Not on file  Social History Narrative  . Not on file   Social Determinants of Health   Financial Resource Strain:   . Difficulty of Paying Living Expenses: Not on file  Food Insecurity:   . Worried About Programme researcher, broadcasting/film/video in the Last Year: Not on file  . Ran Out of Food in the Last Year: Not on file  Transportation Needs:   . Lack of Transportation (Medical): Not on file  . Lack of Transportation (Non-Medical): Not on file  Physical Activity:   . Days of Exercise per Week: Not on file  . Minutes of Exercise per Session: Not on file  Stress:   . Feeling of Stress : Not on file  Social Connections:   . Frequency of Communication with Friends and Family: Not on file  . Frequency of Social Gatherings with Friends and Family: Not on file  . Attends Religious Services: Not on file  . Active Member of Clubs or Organizations: Not on file  . Attends Banker Meetings: Not on file  . Marital Status: Not on file  Intimate Partner Violence:   . Fear of Current or Ex-Partner: Not on file  . Emotionally Abused: Not on file  . Physically Abused: Not on file  . Sexually Abused: Not on file    Allergies  Allergen Reactions  . Fluvoxamine Anxiety    Prior to Admission medications   Medication Sig Start Date End Date Taking? Authorizing Provider  Prenatal Vit-Fe Fumarate-FA (PRENATAL VITAMIN PO) Take by mouth.    [provider]    Physical Exam BP 122/74   Wt 131 lb (59.4 kg)   LMP 12/09/2018   BMI 24.75 kg/m   Physical Exam Exam conducted with a chaperone present.  Constitutional:      General: She is not in acute distress.    Appearance: Normal appearance.  HENT:     Head: Normocephalic and atraumatic.  Eyes:     General: No scleral icterus.     Conjunctiva/sclera: Conjunctivae normal.  Cardiovascular:     Rate and Rhythm: Normal rate and regular rhythm.     Heart sounds: No murmur. No friction rub. No gallop.   Pulmonary:     Effort: No respiratory distress.     Breath sounds: Normal breath sounds. No wheezing, rhonchi or rales.  Abdominal:     General: There is no distension.     Palpations: Abdomen is soft.  Tenderness: There is no abdominal tenderness. There is no guarding or rebound.  Genitourinary:    General: Normal vulva.     Exam position: Lithotomy position.     Tanner stage (genital): 5.     Labia:        Right: No rash, tenderness, lesion or injury.        Left: No rash, tenderness, lesion or injury.      Vagina: Normal.     Cervix: Normal.     Adnexa: Right adnexa normal and left adnexa normal.  Musculoskeletal:        General: No swelling. Normal range of motion.  Skin:    General: Skin is warm and dry.     Coloration: Skin is not jaundiced.  Neurological:     General: No focal deficit present.     Mental Status: She is alert and oriented to person, place, and time.     Cranial Nerves: No cranial nerve deficit.  Psychiatric:        Mood and Affect: Mood normal.        Behavior: Behavior normal.        Judgment: Judgment normal.     Bedside transvaginal ultrasound (performed by me): Single, living intrauterine pregnancy with HR 157 bpm CRL 1.22 cm, GA [redacted]w[redacted]d, EDD 8/31 Small subchorionic hemorrhage 2.6 x 3.7 mm. Left ovary- appears normal Right ovary two simple appearing cysts, one likely the corpus luteal cyst, measuring 2.54 x 1.32 cm and 1.6 x 0.76 cm Yolk sac measures 2.3 mm.   Cervix appears normal.    Female Chaperone present during breast and/or pelvic exam.   Assessment: 29 y.o. G2P0101 at [redacted]w[redacted]d presenting to initiate prenatal care  Plan: 1) Avoid alcoholic beverages. 2) Patient encouraged not to smoke.  3) Discontinue the use of all non-medicinal drugs and chemicals.  4) Take  prenatal vitamins daily.  5) Nutrition, food safety (fish, cheese advisories, and high nitrite foods) and exercise discussed. 6) Hospital and practice style discussed with cross coverage system.  7) Genetic Screening, such as with 1st Trimester Screening, cell free fetal DNA, AFP testing, and Ultrasound, as well as with amniocentesis and CVS as appropriate, is discussed with patient. At the conclusion of today's visit patient undecided genetic testing 8) Patient is asked about travel to areas at risk for the [place] virus, and counseled to avoid travel and exposure to mosquitoes or sexual partners who may have themselves been exposed to the virus. Testing is discussed, and will be ordered as appropriate.  9) History of preterm delivery 36 weeks. Offered 17OHP starting at 16 weeks. She will likely utilize this. 10) constipation: she will start with colace and use for 3 days, then if no BM, will use Miralax.  If no BM, consider lactulose vs other.   [name], MD 01/28/2019 11:40 AM

## 2019-01-29 LAB — URINE DRUG PANEL 7
Amphetamines, Urine: NEGATIVE ng/mL
Barbiturate Quant, Ur: NEGATIVE ng/mL
Benzodiazepine Quant, Ur: NEGATIVE ng/mL
Cannabinoid Quant, Ur: NEGATIVE ng/mL
Cocaine (Metab.): NEGATIVE ng/mL
Opiate Quant, Ur: NEGATIVE ng/mL
PCP Quant, Ur: NEGATIVE ng/mL

## 2019-01-30 LAB — URINE CULTURE: Organism ID, Bacteria: NO GROWTH

## 2019-02-02 LAB — RPR+RH+ABO+RUB AB+AB SCR+CB...
Antibody Screen: NEGATIVE
HIV Screen 4th Generation wRfx: NONREACTIVE
Hematocrit: 41.3 % (ref 34.0–46.6)
Hemoglobin: 13.7 g/dL (ref 11.1–15.9)
Hepatitis B Surface Ag: NEGATIVE
MCH: 30 pg (ref 26.6–33.0)
MCHC: 33.2 g/dL (ref 31.5–35.7)
MCV: 91 fL (ref 79–97)
Platelets: 308 10*3/uL (ref 150–450)
RBC: 4.56 x10E6/uL (ref 3.77–5.28)
RDW: 11.7 % (ref 11.7–15.4)
RPR Ser Ql: NONREACTIVE
Rh Factor: POSITIVE
Rubella Antibodies, IGG: 9.1 index (ref >0.99)
Varicella zoster IgG: 477 index (ref >165)
WBC: 8.2 10*3/uL (ref 3.4–10.8)

## 2019-02-07 LAB — CERVICOVAGINAL ANCILLARY ONLY
Chlamydia: NEGATIVE
Comment: NEGATIVE
Comment: NORMAL
Neisseria Gonorrhea: NEGATIVE

## 2019-02-14 ENCOUNTER — Other Ambulatory Visit: Payer: Self-pay | Admitting: Obstetrics and Gynecology

## 2019-02-14 DIAGNOSIS — O99619 Diseases of the digestive system complicating pregnancy, unspecified trimester: Secondary | ICD-10-CM | POA: Insufficient documentation

## 2019-02-14 DIAGNOSIS — K5900 Constipation, unspecified: Secondary | ICD-10-CM | POA: Insufficient documentation

## 2019-02-14 DIAGNOSIS — O0991 Supervision of high risk pregnancy, unspecified, first trimester: Secondary | ICD-10-CM

## 2019-02-14 DIAGNOSIS — O99611 Diseases of the digestive system complicating pregnancy, first trimester: Secondary | ICD-10-CM

## 2019-02-14 MED ORDER — LACTULOSE 10 G PO PACK: 10.0000 g | PACK | Freq: Two times a day (BID) | ORAL | 1 refills | Status: DC

## 2019-02-23 ENCOUNTER — Ambulatory Visit (INDEPENDENT_AMBULATORY_CARE_PROVIDER_SITE_OTHER): Payer: 59 | Admitting: Obstetrics and Gynecology

## 2019-02-23 ENCOUNTER — Other Ambulatory Visit: Payer: Self-pay

## 2019-02-23 ENCOUNTER — Encounter: Payer: Self-pay | Admitting: Obstetrics and Gynecology

## 2019-02-23 VITALS — BP 120/74 | Wt 133.0 lb

## 2019-02-23 DIAGNOSIS — O09899 Supervision of other high risk pregnancies, unspecified trimester: Secondary | ICD-10-CM

## 2019-02-23 DIAGNOSIS — O09891 Supervision of other high risk pregnancies, first trimester: Secondary | ICD-10-CM

## 2019-02-23 DIAGNOSIS — O99611 Diseases of the digestive system complicating pregnancy, first trimester: Secondary | ICD-10-CM

## 2019-02-23 DIAGNOSIS — Z3A10 10 weeks gestation of pregnancy: Secondary | ICD-10-CM

## 2019-02-23 DIAGNOSIS — Z1379 Encounter for other screening for genetic and chromosomal anomalies: Secondary | ICD-10-CM

## 2019-02-23 DIAGNOSIS — K581 Irritable bowel syndrome with constipation: Secondary | ICD-10-CM

## 2019-02-23 DIAGNOSIS — O0991 Supervision of high risk pregnancy, unspecified, first trimester: Secondary | ICD-10-CM

## 2019-02-23 DIAGNOSIS — K5900 Constipation, unspecified: Secondary | ICD-10-CM

## 2019-02-23 MED ORDER — LACTULOSE 10 GM/15ML PO SOLN: 10.0000 g | Freq: Two times a day (BID) | ORAL | 2 refills | Status: DC

## 2019-02-23 NOTE — Progress Notes (Signed)
Routine Prenatal Care Visit  Subjective  Dorothy Townsend is a 29 y.o. G2P0101 at [redacted]w[redacted]d being seen today for ongoing prenatal care.  She is currently monitored for the following issues for this low-risk pregnancy and has GAD (generalized anxiety disorder); Irritable bowel syndrome with constipation; Screening cholesterol level; Other fatigue; Constipation; Supervision of high risk pregnancy in first trimester; History of preterm delivery, currently pregnant; and Constipation during pregnancy on their problem list.  ----------------------------------------------------------------------------------- Patient reports constipation.    . Vag. Bleeding: None.   . Leaking Fluid denies.  ----------------------------------------------------------------------------------- The following portions of the patient's history were reviewed and updated as appropriate: allergies, current medications, past family history, past medical history, past social history, past surgical history and problem list. Problem list updated.  Objective  Blood pressure 120/74, weight 133 lb (60.3 kg), last menstrual period 12/09/2018, not currently breastfeeding. Pregravid weight 131 lb (59.4 kg) Total Weight Gain 2 lb (0.907 kg) Urinalysis: Urine Protein    Urine Glucose    Fetal Status: Fetal Heart Rate (bpm): 168 ([place])         General:  Alert, oriented and cooperative. Patient is in no acute distress.  Skin: Skin is warm and dry. No rash noted.   Cardiovascular: Normal heart rate noted  Respiratory: Normal respiratory effort, no problems with respiration noted  Abdomen: Soft, gravid, appropriate for gestational age.       Pelvic:  Cervical exam deferred        Extremities: Normal range of motion.     Mental Status: Normal mood and affect. Normal behavior. Normal judgment and thought content.   Assessment   29 y.o. G2P0101 at [redacted]w[redacted]d by  09/15/2019, by Last Menstrual Period presenting for routine prenatal visit  Plan    pregnancy Problems (from 01/28/19 to present)    Problem Noted Resolved   Constipation during pregnancy 02/14/2019 by Conard Novak, MD No   Constipation 01/28/2019 by Conard Novak, MD No   Supervision of high risk pregnancy in first trimester 01/28/2019 by Conard Novak, MD No   Overview Signed 01/28/2019 12:50 PM by Conard Novak, MD    [facility] Prenatal Labs  Dating L=7 Blood type:     Genetic Screen 1 Screen:    AFP:     Quad:     NIPS: Antibody:   Anatomic [place]  Rubella:   Varicella: @VZVIGG @  GTT Early:               Third trimester:  RPR:     Rhogam  HBsAg:     TDaP vaccine                       Flu Shot: HIV:     Baby Food                                GBS:   Contraception  Pap:  CBB     CS/VBAC    Support Person            History of preterm delivery, currently pregnant 01/28/2019 by 01/30/2019, MD No   Overview Signed 01/28/2019 12:51 PM by 01/30/2019, MD    [ ]  17OHP [ ]  16 week cervical length u/s          Preterm labor symptoms and general obstetric precautions including but not limited to vaginal bleeding, contractions, leaking of fluid  and fetal movement were reviewed in detail with the patient. Please refer to After Visit Summary for other counseling recommendations.   - Rx lactulose soln  - NIPT today - discussed 17OHP injections vs suppositories at 16 weeks  Return in about 2 weeks (around 03/09/2019) for Routine Prenatal Appointment.  Prentice Docker, MD, [facility], [facility] 02/23/2019 3:21 PM

## 2019-03-03 LAB — MATERNIT 21 PLUS CORE, BLOOD
Fetal Fraction: 7
Result (T21): NEGATIVE
Trisomy 13 (Patau syndrome): NEGATIVE
Trisomy 18 (Edwards syndrome): NEGATIVE
Trisomy 21 (Down syndrome): NEGATIVE

## 2019-03-14 ENCOUNTER — Encounter: Payer: Self-pay | Admitting: Obstetrics and Gynecology

## 2019-03-14 ENCOUNTER — Ambulatory Visit (INDEPENDENT_AMBULATORY_CARE_PROVIDER_SITE_OTHER): Payer: 59 | Admitting: Obstetrics and Gynecology

## 2019-03-14 ENCOUNTER — Other Ambulatory Visit: Payer: Self-pay

## 2019-03-14 VITALS — BP 122/70 | Wt 135.0 lb

## 2019-03-14 DIAGNOSIS — O99612 Diseases of the digestive system complicating pregnancy, second trimester: Secondary | ICD-10-CM

## 2019-03-14 DIAGNOSIS — O0991 Supervision of high risk pregnancy, unspecified, first trimester: Secondary | ICD-10-CM

## 2019-03-14 DIAGNOSIS — K5900 Constipation, unspecified: Secondary | ICD-10-CM

## 2019-03-14 DIAGNOSIS — O09892 Supervision of other high risk pregnancies, second trimester: Secondary | ICD-10-CM

## 2019-03-14 DIAGNOSIS — O09899 Supervision of other high risk pregnancies, unspecified trimester: Secondary | ICD-10-CM

## 2019-03-14 DIAGNOSIS — O99611 Diseases of the digestive system complicating pregnancy, first trimester: Secondary | ICD-10-CM

## 2019-03-14 DIAGNOSIS — Z3A13 13 weeks gestation of pregnancy: Secondary | ICD-10-CM

## 2019-03-14 MED ORDER — HYDROXYPROGESTERONE CAPROATE 250 MG/ML IM OIL: 250.0000 mg | TOPICAL_OIL | Freq: Once | INTRAMUSCULAR | 20 refills | Status: DC

## 2019-03-14 NOTE — Progress Notes (Signed)
Routine Prenatal Care Visit  Subjective  Dorothy Townsend is a 29 y.o. G2P0101 at [redacted]w[redacted]d being seen today for ongoing prenatal care.  She is currently monitored for the following issues for this high-risk pregnancy and has GAD (generalized anxiety disorder); Irritable bowel syndrome with constipation; Screening cholesterol level; Other fatigue; Constipation; Supervision of high risk pregnancy in first trimester; History of preterm delivery, currently pregnant; and Constipation during pregnancy on their problem list.  ----------------------------------------------------------------------------------- Patient reports some vaginal pressure. Denies urinary and vaginal issues.    . Vag. Bleeding: None.   . Leaking Fluid denies.  ----------------------------------------------------------------------------------- The following portions of the patient's history were reviewed and updated as appropriate: allergies, current medications, past family history, past medical history, past social history, past surgical history and problem list. Problem list updated.  Objective  Blood pressure 122/70, weight 135 lb (61.2 kg), last menstrual period 12/09/2018, not currently breastfeeding. Pregravid weight 131 lb (59.4 kg) Total Weight Gain 4 lb (1.814 kg) Urinalysis: Urine Protein    Urine Glucose    Fetal Status: Fetal Heart Rate (bpm): 155         General:  Alert, oriented and cooperative. Patient is in no acute distress.  Skin: Skin is warm and dry. No rash noted.   Cardiovascular: Normal heart rate noted  Respiratory: Normal respiratory effort, no problems with respiration noted  Abdomen: Soft, gravid, appropriate for gestational age.       Pelvic:  Cervical exam performed Dilation: Closed      Extremities: Normal range of motion.     Mental Status: Normal mood and affect. Normal behavior. Normal judgment and thought content.   Female chaperone present for pelvic exam:   Assessment   29 y.o.  G2P0101 at [redacted]w[redacted]d by  09/15/2019, by Last Menstrual Period presenting for routine prenatal visit  Plan   pregnancy Problems (from 01/28/19 to present)    Problem Noted Resolved   Constipation during pregnancy 02/14/2019 by Will Bonnet, MD No   Constipation 01/28/2019 by Will Bonnet, MD No   Supervision of high risk pregnancy in first trimester 01/28/2019 by Will Bonnet, MD No   Overview Signed 01/28/2019 12:50 PM by Will Bonnet, MD    [facility] Prenatal Labs  Dating L=7 Blood type:     Genetic Screen 1 Screen:    AFP:     Quad:     NIPS: Antibody:   Anatomic [place]  Rubella:   Varicella: @VZVIGG @  GTT Early:               Third trimester:  RPR:     Rhogam  HBsAg:     TDaP vaccine                       Flu Shot: HIV:     Baby Food                                GBS:   Contraception  Pap:  CBB     CS/VBAC    Support Person            History of preterm delivery, currently pregnant 01/28/2019 by Will Bonnet, MD No   Overview Signed 01/28/2019 12:51 PM by Will Bonnet, MD    [ ]  17OHP [ ]  16 week cervical length u/s          Preterm labor symptoms and  general obstetric precautions including but not limited to vaginal bleeding, contractions, leaking of fluid and fetal movement were reviewed in detail with the patient. Please refer to After Visit Summary for other counseling recommendations.   - 17OHP ordered today - transvaginal ultrasound for cervical length. Unable to tell on transabdominal today. - no evidence of early dilation today.  - milk of magnesia for constipation.   Return in about 1 week (around 03/21/2019) for U/S for cervical length and f/u SDJ ROB (within the week) by double/overbook.  [name], MD, [facility], [facility] 03/14/2019 4:28 PM

## 2019-03-23 ENCOUNTER — Other Ambulatory Visit: Payer: Self-pay

## 2019-03-23 ENCOUNTER — Encounter: Payer: Self-pay | Admitting: Obstetrics and Gynecology

## 2019-03-23 ENCOUNTER — Ambulatory Visit (INDEPENDENT_AMBULATORY_CARE_PROVIDER_SITE_OTHER): Payer: 59

## 2019-03-23 ENCOUNTER — Ambulatory Visit (INDEPENDENT_AMBULATORY_CARE_PROVIDER_SITE_OTHER): Payer: 59 | Admitting: Obstetrics and Gynecology

## 2019-03-23 VITALS — BP 122/70 | Wt 135.0 lb

## 2019-03-23 DIAGNOSIS — O0990 Supervision of high risk pregnancy, unspecified, unspecified trimester: Secondary | ICD-10-CM

## 2019-03-23 DIAGNOSIS — Z3686 Encounter for antenatal screening for cervical length: Secondary | ICD-10-CM

## 2019-03-23 DIAGNOSIS — Z3A14 14 weeks gestation of pregnancy: Secondary | ICD-10-CM | POA: Diagnosis not present

## 2019-03-23 DIAGNOSIS — O09212 Supervision of pregnancy with history of pre-term labor, second trimester: Secondary | ICD-10-CM

## 2019-03-23 DIAGNOSIS — Z3A13 13 weeks gestation of pregnancy: Secondary | ICD-10-CM

## 2019-03-23 DIAGNOSIS — O0991 Supervision of high risk pregnancy, unspecified, first trimester: Secondary | ICD-10-CM

## 2019-03-23 DIAGNOSIS — O99612 Diseases of the digestive system complicating pregnancy, second trimester: Secondary | ICD-10-CM

## 2019-03-23 DIAGNOSIS — O09899 Supervision of other high risk pregnancies, unspecified trimester: Secondary | ICD-10-CM

## 2019-03-23 DIAGNOSIS — K5900 Constipation, unspecified: Secondary | ICD-10-CM

## 2019-03-23 DIAGNOSIS — O09892 Supervision of other high risk pregnancies, second trimester: Secondary | ICD-10-CM

## 2019-03-23 DIAGNOSIS — O0992 Supervision of high risk pregnancy, unspecified, second trimester: Secondary | ICD-10-CM

## 2019-03-23 MED ORDER — PROGESTERONE 200 MG VA SUPP: 200.0000 mg | Freq: Every day | VAGINAL | 5 refills | Status: DC

## 2019-03-23 NOTE — Progress Notes (Signed)
Routine Prenatal Care Visit  Subjective  Dorothy Townsend is a 29 y.o. G2P0101 at [redacted]w[redacted]d being seen today for ongoing prenatal care.  She is currently monitored for the following issues for this high-risk pregnancy and has GAD (generalized anxiety disorder); Irritable bowel syndrome with constipation; Screening cholesterol level; Other fatigue; Constipation; Supervision of high risk pregnancy, antepartum; History of preterm delivery, currently pregnant; and Constipation during pregnancy on their problem list.  ----------------------------------------------------------------------------------- Patient reports continued constipation. Attempted an enema over the weekend. Was painful with a little response.    . Vag. Bleeding: None.   . Leaking Fluid denies.  TVUS: cervical length 3.3 cm today.  ----------------------------------------------------------------------------------- The following portions of the patient's history were reviewed and updated as appropriate: allergies, current medications, past family history, past medical history, past social history, past surgical history and problem list. Problem list updated.  Objective  Blood pressure 122/70, weight 135 lb (61.2 kg), last menstrual period 12/09/2018, not currently breastfeeding. Pregravid weight 131 lb (59.4 kg) Total Weight Gain 4 lb (1.814 kg) Urinalysis: Urine Protein    Urine Glucose    Fetal Status: Fetal Heart Rate (bpm): 153         General:  Alert, oriented and cooperative. Patient is in no acute distress.  Skin: Skin is warm and dry. No rash noted.   Cardiovascular: Normal heart rate noted  Respiratory: Normal respiratory effort, no problems with respiration noted  Abdomen: Soft, gravid, appropriate for gestational age.       Pelvic:  Cervical exam deferred        Extremities: Normal range of motion.     Mental Status: Normal mood and affect. Normal behavior. Normal judgment and thought content.   Imaging Results US  OB Transvaginal  Result Date: 03/23/2019 Patient Name: AVENELL SELLERS DOB: 1990/11/20 MRN: [id number] ULTRASOUND REPORT Location: [facility] Date of Service: 03/23/2019 Indications:Previous preterm delivery Findings: Nelda [place] intrauterine pregnancy is visualized. FHR: 153 BPM Stomach:  not visualized Kidneys: not visualized Bladder: visualized Transvaginal cervical length performed. Cervical length measures  3.3 cm in the shortest dimension. There is no change with fundal pressure. No funneling is present. Impression: 1. [redacted]w[redacted]d viable [place] Intrauterine pregnancy by previously established criteria. 2. Cervical length is 3.3 cm. Gweneth Dimitri, RT The ultrasound images and findings were reviewed by me and I agree with the above report. Prentice Docker, MD, [facility], [facility] 03/23/2019 11:27 AM       Assessment   29 y.o. G2P0101 at [redacted]w[redacted]d by  09/15/2019, by Last Menstrual Period presenting for routine prenatal visit  Plan   pregnancy Problems (from 01/28/19 to present)    Problem Noted Resolved   Constipation during pregnancy 02/14/2019 by Will Bonnet, MD No   Constipation 01/28/2019 by Will Bonnet, MD No   Supervision of high risk pregnancy, antepartum 01/28/2019 by Will Bonnet, MD No   Overview Addendum 03/23/2019 11:28 AM by Will Bonnet, MD    [facility] Prenatal Labs  Dating L=7 Blood type: B/Positive/-- (01/15 1255)   Genetic Screen 1 Screen:    AFP:     Quad:     NIPS: Antibody:Negative (01/15 1255)  Anatomic [place]  Rubella: 9.10 (01/15 1255) Varicella: @VZVIGG @  GTT Early:               Third trimester:  RPR: Non Reactive (01/15 1255)   Rhogam  HBsAg: Negative (01/15 1255)   TDaP vaccine  Flu Shot: HIV: Non Reactive (01/15 1255)   Baby Food                                GBS:   Contraception  Pap:  CBB     CS/VBAC    Support Person            History of preterm delivery, currently pregnant  01/28/2019 by Conard Novak, MD No   Overview Signed 01/28/2019 12:51 PM by Conard Novak, MD    [ ]  17OHP [ ]  16 week cervical length u/s          Preterm labor symptoms and general obstetric precautions including but not limited to vaginal bleeding, contractions, leaking of fluid and fetal movement were reviewed in detail with the patient. Please refer to After Visit Summary for other counseling recommendations.   - Start vaginal suppositories nightly at 16 weeks.  Rx given.  Return in about 3 weeks (around 04/13/2019) for anatomy u/s (if 18 weeks or later) and routine prenatal with Dr. .  04/15/2019, MD, [facility], [facility] 03/23/2019 1:11 PM

## 2019-03-23 NOTE — Addendum Note (Signed)
Addended by: [name] D on: 03/23/2019 05:38 PM   Modules accepted: Orders

## 2019-04-20 ENCOUNTER — Ambulatory Visit (INDEPENDENT_AMBULATORY_CARE_PROVIDER_SITE_OTHER): Payer: 59

## 2019-04-20 ENCOUNTER — Ambulatory Visit (INDEPENDENT_AMBULATORY_CARE_PROVIDER_SITE_OTHER): Payer: 59 | Admitting: Obstetrics and Gynecology

## 2019-04-20 ENCOUNTER — Encounter: Payer: Self-pay | Admitting: Obstetrics and Gynecology

## 2019-04-20 ENCOUNTER — Other Ambulatory Visit: Payer: Self-pay

## 2019-04-20 VITALS — BP 118/74 | Wt 138.0 lb

## 2019-04-20 DIAGNOSIS — O0992 Supervision of high risk pregnancy, unspecified, second trimester: Secondary | ICD-10-CM

## 2019-04-20 DIAGNOSIS — O4402 Placenta previa specified as without hemorrhage, second trimester: Secondary | ICD-10-CM

## 2019-04-20 DIAGNOSIS — K5900 Constipation, unspecified: Secondary | ICD-10-CM

## 2019-04-20 DIAGNOSIS — Z3A19 19 weeks gestation of pregnancy: Secondary | ICD-10-CM

## 2019-04-20 DIAGNOSIS — O09899 Supervision of other high risk pregnancies, unspecified trimester: Secondary | ICD-10-CM

## 2019-04-20 DIAGNOSIS — Z3A18 18 weeks gestation of pregnancy: Secondary | ICD-10-CM

## 2019-04-20 DIAGNOSIS — O99612 Diseases of the digestive system complicating pregnancy, second trimester: Secondary | ICD-10-CM

## 2019-04-20 DIAGNOSIS — Z363 Encounter for antenatal screening for malformations: Secondary | ICD-10-CM | POA: Diagnosis not present

## 2019-04-20 DIAGNOSIS — O4400 Placenta previa specified as without hemorrhage, unspecified trimester: Secondary | ICD-10-CM | POA: Insufficient documentation

## 2019-04-20 NOTE — Progress Notes (Signed)
Routine Prenatal Care Visit  Subjective  Dorothy Townsend is a 29 y.o. G2P0101 at [redacted]w[redacted]d being seen today for ongoing prenatal care.  She is currently monitored for the following issues for this high-risk pregnancy and has GAD (generalized anxiety disorder); Irritable bowel syndrome with constipation; Screening cholesterol level; Other fatigue; Constipation; Supervision of high risk pregnancy, antepartum; History of preterm delivery, currently pregnant; Constipation during pregnancy; and Placenta previa antepartum on their problem list.  ----------------------------------------------------------------------------------- Patient reports no complaints.    . Vag. Bleeding: None.  Movement: Present. Leaking Fluid denies.  Anatomy u/s complete. Previa noted ----------------------------------------------------------------------------------- The following portions of the patient's history were reviewed and updated as appropriate: allergies, current medications, past family history, past medical history, past social history, past surgical history and problem list. Problem list updated.  Objective  Blood pressure 118/74, weight 138 lb (62.6 kg), last menstrual period 12/09/2018. Pregravid weight 131 lb (59.4 kg) Total Weight Gain 7 lb (3.175 kg) Urinalysis: Urine Protein    Urine Glucose    Fetal Status: Fetal Heart Rate (bpm): 140   Movement: Present     General:  Alert, oriented and cooperative. Patient is in no acute distress.  Skin: Skin is warm and dry. No rash noted.   Cardiovascular: Normal heart rate noted  Respiratory: Normal respiratory effort, no problems with respiration noted  Abdomen: Soft, gravid, appropriate for gestational age.       Pelvic:  Cervical exam deferred        Extremities: Normal range of motion.     Mental Status: Normal mood and affect. Normal behavior. Normal judgment and thought content.   Imaging Results US OB Comp + 14 Wk  Result Date: 04/20/2019 Patient  Name: Dorothy Townsend DOB: 25-Apr-1990 MRN: [id number] ULTRASOUND REPORT Location: [facility] Date of Service: 04/20/2019 Indications:Anatomy Ultrasound Findings: Dorothy Townsend intrauterine pregnancy is visualized with FHR at 140 BPM. Biometrics give an (U/S) Gestational age of [redacted]w[redacted]d and an (U/S) EDD of 09/14/2019; this correlates with the clinically established Estimated Date of Delivery: 09/15/2019. Fetal presentation is Variable. EFW: 270 g (10 oz). Placenta: posterior partial previa. Grade: 1 The cervix is long and closed at 4.6 cm AFI: subjectively normal. Anatomic survey is complete and normal; Gender - female.  Impression: 1. [redacted]w[redacted]d Viable Singleton Intrauterine pregnancy by U/S. 2. (U/S) EDD is consistent with Clinically established Estimated Date of Delivery: 09/15/19 . 3. Normal Anatomy Scan 4. There is a posterior marginal placenta previa. Recommendations: 1.Clinical correlation with the patient's History and Physical Exam. 2. Repeat ultrasound for placentation at 28 weeks. Gweneth Dimitri, RT There is a singleton gestation with subjectively normal amniotic fluid volume. The fetal biometry correlates with established dating. Detailed evaluation of the fetal anatomy was performed.The fetal anatomical survey appears within normal limits within the resolution of ultrasound as described above.  It must be noted that a normal ultrasound is unable to rule out fetal aneuploidy nor is it able to detect all possible malformations.   This includes heart defects such as outflow tracts (right ventricle and left). The ultrasound images and findings were reviewed by me and I agree with the above report. Prentice Docker, MD, [facility], [facility] 04/20/2019 12:17 PM       Assessment   29 y.o. G2P0101 at [redacted]w[redacted]d by  09/15/2019, by Last Menstrual Period presenting for routine prenatal visit  Plan   pregnancy Problems (from 01/28/19 to present)    Problem Noted Resolved   Placenta previa  antepartum  04/20/2019 by Conard Novak, MD No   Constipation during pregnancy 02/14/2019 by Conard Novak, MD No   Constipation 01/28/2019 by Conard Novak, MD No   Supervision of high risk pregnancy, antepartum 01/28/2019 by Conard Novak, MD No   Overview Addendum 03/23/2019 11:28 AM by Conard Novak, MD    [facility] Prenatal Labs  Dating L=7 Blood type: B/Positive/-- (01/15 1255)   Genetic Screen 1 Screen:    AFP:     Quad:     NIPS: Antibody:Negative (01/15 1255)  Anatomic [place]  Rubella: 9.10 (01/15 1255) Varicella: @VZVIGG @  GTT Early:               Third trimester:  RPR: Non Reactive (01/15 1255)   Rhogam  HBsAg: Negative (01/15 1255)   TDaP vaccine                       Flu Shot: HIV: Non Reactive (01/15 1255)   Baby Food                                GBS:   Contraception  Pap:  CBB     CS/VBAC    Support Person            History of preterm delivery, currently pregnant 01/28/2019 by 01/30/2019, MD No   Overview Signed 01/28/2019 12:51 PM by 01/30/2019, MD    [ ]  17OHP [ ]  16 week cervical length u/s          Preterm labor symptoms and general obstetric precautions including but not limited to vaginal bleeding, contractions, leaking of fluid and fetal movement were reviewed in detail with the patient. Please refer to After Visit Summary for other counseling recommendations.   - follow up placentation at 28 weeks  Return in about 4 weeks (around 05/18/2019) for Routine Prenatal Appointment.  , MD, OB/GYN, [facility] 04/20/2019 1:35 PM

## 2019-05-02 ENCOUNTER — Other Ambulatory Visit: Payer: Self-pay

## 2019-05-18 ENCOUNTER — Encounter: Payer: Self-pay | Admitting: Obstetrics and Gynecology

## 2019-05-18 ENCOUNTER — Ambulatory Visit (INDEPENDENT_AMBULATORY_CARE_PROVIDER_SITE_OTHER): Payer: 59 | Admitting: Obstetrics and Gynecology

## 2019-05-18 ENCOUNTER — Other Ambulatory Visit: Payer: Self-pay

## 2019-05-18 VITALS — BP 100/60 | Wt 144.0 lb

## 2019-05-18 DIAGNOSIS — Z131 Encounter for screening for diabetes mellitus: Secondary | ICD-10-CM

## 2019-05-18 DIAGNOSIS — O4402 Placenta previa specified as without hemorrhage, second trimester: Secondary | ICD-10-CM

## 2019-05-18 DIAGNOSIS — Z3A22 22 weeks gestation of pregnancy: Secondary | ICD-10-CM

## 2019-05-18 DIAGNOSIS — Z113 Encounter for screening for infections with a predominantly sexual mode of transmission: Secondary | ICD-10-CM

## 2019-05-18 DIAGNOSIS — O0992 Supervision of high risk pregnancy, unspecified, second trimester: Secondary | ICD-10-CM

## 2019-05-18 DIAGNOSIS — O99612 Diseases of the digestive system complicating pregnancy, second trimester: Secondary | ICD-10-CM

## 2019-05-18 DIAGNOSIS — K5900 Constipation, unspecified: Secondary | ICD-10-CM

## 2019-05-18 DIAGNOSIS — O09899 Supervision of other high risk pregnancies, unspecified trimester: Secondary | ICD-10-CM

## 2019-05-18 NOTE — Progress Notes (Signed)
Routine Prenatal Care Visit  Subjective  Dorothy Townsend is a 29 y.o. G2P0101 at [redacted]w[redacted]d being seen today for ongoing prenatal care.  She is currently monitored for the following issues for this high-risk pregnancy and has GAD (generalized anxiety disorder); Irritable bowel syndrome with constipation; Screening cholesterol level; Other fatigue; Constipation; Supervision of high risk pregnancy, antepartum; History of preterm delivery, currently pregnant; Constipation during pregnancy; and Placenta previa antepartum on their problem list.  ----------------------------------------------------------------------------------- Patient reports tightening in her abdomen, consistent with occasional, strong contractions.     . Vag. Bleeding: None.  Movement: Present. Leaking Fluid denies.  ----------------------------------------------------------------------------------- The following portions of the patient's history were reviewed and updated as appropriate: allergies, current medications, past family history, past medical history, past social history, past surgical history and problem list. Problem list updated.  Objective  Blood pressure 100/60, weight 144 lb (65.3 kg), last menstrual period 12/09/2018. Pregravid weight 131 lb (59.4 kg) Total Weight Gain 13 lb (5.897 kg) Urinalysis: Urine Protein    Urine Glucose    Fetal Status: Fetal Heart Rate (bpm): 154   Movement: Present     General:  Alert, oriented and cooperative. Patient is in no acute distress.  Skin: Skin is warm and dry. No rash noted.   Cardiovascular: Normal heart rate noted  Respiratory: Normal respiratory effort, no problems with respiration noted  Abdomen: Soft, gravid, appropriate for gestational age. Pain/Pressure: Present     Pelvic:  Cervical exam performed Dilation: Closed Effacement (%): 0   (visually assessed)  Extremities: Normal range of motion.     Mental Status: Normal mood and affect. Normal behavior. Normal judgment  and thought content.   Assessment   29 y.o. G2P0101 at [redacted]w[redacted]d by  09/15/2019, by Last Menstrual Period presenting for routine prenatal visit  Plan   pregnancy Problems (from 01/28/19 to present)    Problem Noted Resolved   Placenta previa antepartum 04/20/2019 by Will Bonnet, MD No   Constipation during pregnancy 02/14/2019 by Will Bonnet, MD No   Constipation 01/28/2019 by Will Bonnet, MD No   Supervision of high risk pregnancy, antepartum 01/28/2019 by Will Bonnet, MD No   Overview Addendum 03/23/2019 11:28 AM by Will Bonnet, MD    [facility] Prenatal Labs  Dating L=7 Blood type: B/Positive/-- (01/15 1255)   Genetic Screen 1 Screen:    AFP:     Quad:     NIPS: Antibody:Negative (01/15 1255)  Anatomic [place]  Rubella: 9.10 (01/15 1255) Varicella: @VZVIGG @  GTT Early:               Third trimester:  RPR: Non Reactive (01/15 1255)   Rhogam  HBsAg: Negative (01/15 1255)   TDaP vaccine                       Flu Shot: HIV: Non Reactive (01/15 1255)   Baby Food                                GBS:   Contraception  Pap:  CBB     CS/VBAC    Support Person            History of preterm delivery, currently pregnant 01/28/2019 by Will Bonnet, MD No   Overview Signed 01/28/2019 12:51 PM by Will Bonnet, MD    [ ]  17OHP [ ]  16 week cervical length u/s  Preterm labor symptoms and general obstetric precautions including but not limited to vaginal bleeding, contractions, leaking of fluid and fetal movement were reviewed in detail with the patient. Please refer to After Visit Summary for other counseling recommendations.   -Patient reassured about symptoms.  Instructions given for worsening cramping and contractions to return to clinic soon as possible.  Return in about 4 weeks (around 06/15/2019) for u/s for placentation, 28 week labs, routine prenatal.  [name], MD, [facility], [facility] 05/18/2019 6:25 PM

## 2019-06-07 ENCOUNTER — Other Ambulatory Visit: Payer: Self-pay

## 2019-06-07 ENCOUNTER — Ambulatory Visit (INDEPENDENT_AMBULATORY_CARE_PROVIDER_SITE_OTHER): Payer: 59

## 2019-06-07 ENCOUNTER — Ambulatory Visit (INDEPENDENT_AMBULATORY_CARE_PROVIDER_SITE_OTHER): Payer: 59 | Admitting: Obstetrics and Gynecology

## 2019-06-07 ENCOUNTER — Encounter: Payer: Self-pay | Admitting: Obstetrics and Gynecology

## 2019-06-07 VITALS — BP 114/74 | Wt 147.0 lb

## 2019-06-07 DIAGNOSIS — O4402 Placenta previa specified as without hemorrhage, second trimester: Secondary | ICD-10-CM

## 2019-06-07 DIAGNOSIS — O0992 Supervision of high risk pregnancy, unspecified, second trimester: Secondary | ICD-10-CM | POA: Diagnosis not present

## 2019-06-07 DIAGNOSIS — Z3A25 25 weeks gestation of pregnancy: Secondary | ICD-10-CM | POA: Diagnosis not present

## 2019-06-07 DIAGNOSIS — K5900 Constipation, unspecified: Secondary | ICD-10-CM

## 2019-06-07 DIAGNOSIS — O09899 Supervision of other high risk pregnancies, unspecified trimester: Secondary | ICD-10-CM

## 2019-06-07 DIAGNOSIS — O99612 Diseases of the digestive system complicating pregnancy, second trimester: Secondary | ICD-10-CM

## 2019-06-07 NOTE — Progress Notes (Addendum)
Routine Prenatal Care Visit  Subjective  Dorothy Townsend is a 29 y.o. G2P0101 at [redacted]w[redacted]d being seen today for ongoing prenatal care.  She is currently monitored for the following issues for this high-risk pregnancy and has GAD (generalized anxiety disorder); Irritable bowel syndrome with constipation; Screening cholesterol level; Other fatigue; Constipation; Supervision of high risk pregnancy, antepartum; History of preterm delivery, currently pregnant; Constipation during pregnancy; and Placenta previa antepartum on their problem list.  ----------------------------------------------------------------------------------- Patient reports some pink(once) and brown-ish discharge over the weekend. Notes cramping that is at times severe.  Has had some fluid-like substance as part of her discharge, but no gush.    Contractions: Irritability. Vag. Bleeding: None, Scant.  Movement: Present.   ----------------------------------------------------------------------------------- The following portions of the patient's history were reviewed and updated as appropriate: allergies, current medications, past family history, past medical history, past social history, past surgical history and problem list. Problem list updated.  Objective  Blood pressure 114/74, weight 147 lb (66.7 kg), last menstrual period 12/09/2018. Pregravid weight 131 lb (59.4 kg) Total Weight Gain 16 lb (7.258 kg) Urinalysis: Urine Protein    Urine Glucose    Fetal Status: Fetal Heart Rate (bpm): 145   Movement: Present  Presentation: Vertex  General:  Alert, oriented and cooperative. Patient is in no acute distress.  Skin: Skin is warm and dry. No rash noted.   Cardiovascular: Normal heart rate noted  Respiratory: Normal respiratory effort, no problems with respiration noted  Abdomen: Soft, gravid, appropriate for gestational age. Pain/Pressure: Present     Pelvic:  Cervical exam deferred Dilation: Closed    no active bleeding, no  brown discharge appreciated.  Extremities: Normal range of motion.     Mental Status: Normal mood and affect. Normal behavior. Normal judgment and thought content.    Assessment   29 y.o. G2P0101 at [redacted]w[redacted]d by  09/15/2019, by Last Menstrual Period presenting for work-in prenatal visit  Plan   pregnancy Problems (from 01/28/19 to present)    Problem Noted Resolved   Placenta previa antepartum 04/20/2019 by Conard Novak, MD No   Constipation during pregnancy 02/14/2019 by Conard Novak, MD No   Constipation 01/28/2019 by Conard Novak, MD No   Supervision of high risk pregnancy, antepartum 01/28/2019 by Conard Novak, MD No   Overview Addendum 03/23/2019 11:28 AM by Conard Novak, MD    [facility] Prenatal Labs  Dating L=7 Blood type: B/Positive/-- (01/15 1255)   Genetic Screen 1 Screen:    AFP:     Quad:     NIPS: Antibody:Negative (01/15 1255)  Anatomic [place]  Rubella: 9.10 (01/15 1255) Varicella: @VZVIGG @  GTT Early:               Third trimester:  RPR: Non Reactive (01/15 1255)   Rhogam  HBsAg: Negative (01/15 1255)   TDaP vaccine                       Flu Shot: HIV: Non Reactive (01/15 1255)   Baby Food                                GBS:   Contraception  Pap:  CBB     CS/VBAC    Support Person            History of preterm delivery, currently pregnant 01/28/2019 by 01/30/2019, MD No  Overview Signed 01/28/2019 12:51 PM by Will Bonnet, MD    [ ]  17OHP [ ]  16 week cervical length u/s         Preterm labor symptoms and general obstetric precautions including but not limited to vaginal bleeding, contractions, leaking of fluid and fetal movement were reviewed in detail with the patient. Please refer to After Visit Summary for other counseling recommendations.   Return in about 1 day (around 06/08/2019) for Pelvic ultrasound (transvaginal) and follow up Dr. Glennon Mac (phone or in person).  Prentice Docker, MD, [facility], [facility] 06/07/2019 10:13 AM    ADDENDUM: Transvaginal ultrasound performed shows normal cervical length and placenta has moved far enough away for vaginal delivery.  Imaging Results US OB Transvaginal  Result Date: 06/07/2019 Patient Name: Dorothy Townsend DOB: October 26, 1990 MRN: [id number] ULTRASOUND REPORT Location: [facility] Date of Service: 06/07/2019 Indications:Previous preterm delivery Findings: Nelda Marse[place] intrauterine pregnancy is visualized. FHR: 153 BPM Transvaginal cervical length performed. Cervical length measures  3.5 cm in the shortest dimension. There is no change with fundal pressure. No funneling is present. The posterior placenta is 3.8 cm away from the internal cervical os. Impression: 1. [redacted]w[redacted]d viable Singleton Intrauterine pregnancy by previously established criteria. 2. Cervical length is 3.5 cm. 3. Normal placental placement. Gweneth Dimitri, RT The ultrasound images and findings were reviewed by me and I agree with the above report. Prentice Docker, MD, [facility], [facility] 06/07/2019 2:35 PM

## 2019-06-10 ENCOUNTER — Other Ambulatory Visit: Payer: Self-pay

## 2019-06-10 ENCOUNTER — Encounter: Payer: Self-pay | Admitting: Obstetrics and Gynecology

## 2019-06-10 ENCOUNTER — Ambulatory Visit (INDEPENDENT_AMBULATORY_CARE_PROVIDER_SITE_OTHER): Payer: 59 | Admitting: Obstetrics and Gynecology

## 2019-06-10 VITALS — BP 122/74

## 2019-06-10 DIAGNOSIS — O4402 Placenta previa specified as without hemorrhage, second trimester: Secondary | ICD-10-CM

## 2019-06-10 DIAGNOSIS — O4690 Antepartum hemorrhage, unspecified, unspecified trimester: Secondary | ICD-10-CM

## 2019-06-10 DIAGNOSIS — O09899 Supervision of other high risk pregnancies, unspecified trimester: Secondary | ICD-10-CM

## 2019-06-10 DIAGNOSIS — Z3A26 26 weeks gestation of pregnancy: Secondary | ICD-10-CM

## 2019-06-10 DIAGNOSIS — O0992 Supervision of high risk pregnancy, unspecified, second trimester: Secondary | ICD-10-CM

## 2019-06-10 NOTE — Progress Notes (Signed)
Routine Prenatal Care Visit  Subjective  Dorothy Townsend is a 29 y.o. G2P0101 at [redacted]w[redacted]d being seen today for ongoing prenatal care.  She is currently monitored for the following issues for this high-risk pregnancy and has GAD (generalized anxiety disorder); Irritable bowel syndrome with constipation; Screening cholesterol level; Other fatigue; Constipation; Supervision of high risk pregnancy, antepartum; History of preterm delivery, currently pregnant; Constipation during pregnancy; Placenta previa antepartum; and Vaginal bleeding during pregnancy on their problem list.  ----------------------------------------------------------------------------------- Patient reports bright red bleeding this morning that she describes as spotting. This is different in that she has had only brown spotting recently, for which she was evaluated.  She has no associated cramping this morning. She denies recent intercourse. She continues to use vaginal progesterone. She had a pelvic ultrasound due to low lying placenta earlier this week which showed that the placenta had moved and was no longer low lying (it is posterior).     Contractions: Irritability. Vag. Bleeding: Scant.  Movement: Present. Leaking Fluid denies.  ----------------------------------------------------------------------------------- The following portions of the patient's history were reviewed and updated as appropriate: allergies, current medications, past family history, past medical history, past social history, past surgical history and problem list. Problem list updated.  Objective  Blood pressure 122/74, last menstrual period 12/09/2018. Pregravid weight 131 lb (59.4 kg) Total Weight Gain 16 lb (7.258 kg) Urinalysis: Urine Protein    Urine Glucose    Fetal Status: Fetal Heart Rate (bpm): 150   Movement: Present  Presentation: Vertex  General:  Alert, oriented and cooperative. Patient is in no acute distress.  Skin: Skin is warm and dry. No  rash noted.   Cardiovascular: Normal heart rate noted  Respiratory: Normal respiratory effort, no problems with respiration noted  Abdomen: Soft, gravid, appropriate for gestational age. Pain/Pressure: Present     Pelvic:  Cervical exam performed Dilation: Closed Effacement (%): Thick Station: Ballotable. There is a small amount of bright red blood coming from the edge of the cervix. Uncertain whether this is from speculum or the source of her bleeding. No blood coming from inside the cervix.    Extremities: Normal range of motion.     Mental Status: Normal mood and affect. Normal behavior. Normal judgment and thought content.   Bedside ultrasound showed viable fetus with + FCA.  No evidence of placental abruption.  Fluid is subjectively normal.   Assessment   29 y.o. G2P0101 at [redacted]w[redacted]d by  09/15/2019, by Last Menstrual Period presenting for work-in prenatal visit  Plan   pregnancy Problems (from 01/28/19 to present)    Problem Noted Resolved   Vaginal bleeding during pregnancy 06/10/2019 by Will Bonnet, MD No   Placenta previa antepartum 04/20/2019 by Will Bonnet, MD No   Overview Signed 06/10/2019  9:15 AM by Will Bonnet, MD    Resolved at 26 weeks      Constipation during pregnancy 02/14/2019 by Will Bonnet, MD No   Constipation 01/28/2019 by Will Bonnet, MD No   Supervision of high risk pregnancy, antepartum 01/28/2019 by Will Bonnet, MD No   Overview Addendum 03/23/2019 11:28 AM by Will Bonnet, MD    [facility] Prenatal Labs  Dating L=7 Blood type: B/Positive/-- (01/15 1255)   Genetic Screen 1 Screen:    AFP:     Quad:     NIPS: Antibody:Negative (01/15 1255)  Anatomic [place]  Rubella: 9.10 (01/15 1255) Varicella: @VZVIGG @  GTT Early:  Third trimester:  RPR: Non Reactive (01/15 1255)   Rhogam  HBsAg: Negative (01/15 1255)   TDaP vaccine                       Flu Shot: HIV: Non Reactive (01/15 1255)   Baby Food                                 GBS:   Contraception  Pap:  CBB     CS/VBAC    Support Person            History of preterm delivery, currently pregnant 01/28/2019 by Conard Novak, MD No   Overview Signed 01/28/2019 12:51 PM by Conard Novak, MD    [ ]  17OHP [ ]  16 week cervical length u/s          Preterm labor symptoms and general obstetric precautions including but not limited to vaginal bleeding, contractions, leaking of fluid and fetal movement were reviewed in detail with the patient. Please refer to After Visit Summary for other counseling recommendations.   - continue to reiterate precautions. No signs of labor, placental abruption today.  Discussed all findings.  Uncertain source of blood. However, all findings reassuring today.   - discussed whether irritation is occurring due to vaginal suppositories. Benefits vs risks of using suppositories discussed.  I think the benefits outweigh risks with the possible side effect of vaginal/cervical irritation. However, even that is not certain.   -Continue to monitor very closely.   Return if symptoms worsen or fail to improve, for Keep previously scheduled ROB appointment.  , MD, OB/GYN, [facility] 06/10/2019 12:43 PM

## 2019-06-12 LAB — URINE CULTURE: Organism ID, Bacteria: NO GROWTH

## 2019-06-20 ENCOUNTER — Ambulatory Visit (INDEPENDENT_AMBULATORY_CARE_PROVIDER_SITE_OTHER): Payer: 59 | Admitting: Obstetrics and Gynecology

## 2019-06-20 ENCOUNTER — Encounter: Payer: Self-pay | Admitting: Obstetrics and Gynecology

## 2019-06-20 ENCOUNTER — Other Ambulatory Visit: Payer: Self-pay

## 2019-06-20 ENCOUNTER — Ambulatory Visit (INDEPENDENT_AMBULATORY_CARE_PROVIDER_SITE_OTHER): Payer: 59

## 2019-06-20 ENCOUNTER — Other Ambulatory Visit: Payer: 59

## 2019-06-20 VITALS — BP 120/70 | Wt 147.0 lb

## 2019-06-20 DIAGNOSIS — O09892 Supervision of other high risk pregnancies, second trimester: Secondary | ICD-10-CM | POA: Diagnosis not present

## 2019-06-20 DIAGNOSIS — O0992 Supervision of high risk pregnancy, unspecified, second trimester: Secondary | ICD-10-CM

## 2019-06-20 DIAGNOSIS — Z3A27 27 weeks gestation of pregnancy: Secondary | ICD-10-CM

## 2019-06-20 DIAGNOSIS — O09899 Supervision of other high risk pregnancies, unspecified trimester: Secondary | ICD-10-CM

## 2019-06-20 DIAGNOSIS — O4690 Antepartum hemorrhage, unspecified, unspecified trimester: Secondary | ICD-10-CM

## 2019-06-20 DIAGNOSIS — O4402 Placenta previa specified as without hemorrhage, second trimester: Secondary | ICD-10-CM

## 2019-06-20 DIAGNOSIS — Z131 Encounter for screening for diabetes mellitus: Secondary | ICD-10-CM

## 2019-06-20 DIAGNOSIS — O4403 Placenta previa specified as without hemorrhage, third trimester: Secondary | ICD-10-CM

## 2019-06-20 DIAGNOSIS — Z113 Encounter for screening for infections with a predominantly sexual mode of transmission: Secondary | ICD-10-CM

## 2019-06-20 NOTE — Progress Notes (Signed)
Routine Prenatal Care Visit  Subjective  Dorothy Townsend is a 29 y.o. G2P0101 at [redacted]w[redacted]d being seen today for ongoing prenatal care.  She is currently monitored for the following issues for this high-risk pregnancy and has GAD (generalized anxiety disorder); Irritable bowel syndrome with constipation; Screening cholesterol level; Other fatigue; Constipation; Supervision of high risk pregnancy, antepartum; History of preterm delivery, currently pregnant; Constipation during pregnancy; Placenta previa antepartum; and Vaginal bleeding during pregnancy on their problem list.  ----------------------------------------------------------------------------------- Patient reports daily cramping that is worse.  Still with scant bleeding.   Contractions: Irritability. Vag. Bleeding: Scant.  Movement: Present. Leaking Fluid denies.  Pelvic u/s today is reassuring, normal cervical length and there is no funneling and functional shortening. Placenta is still > 2 cm from cervical os.  ----------------------------------------------------------------------------------- The following portions of the patient's history were reviewed and updated as appropriate: allergies, current medications, past family history, past medical history, past social history, past surgical history and problem list. Problem list updated.  Objective  Blood pressure 120/70, weight 147 lb (66.7 kg), last menstrual period 12/09/2018. Pregravid weight 131 lb (59.4 kg) Total Weight Gain 16 lb (7.258 kg) Urinalysis: Urine Protein    Urine Glucose    Fetal Status: Fetal Heart Rate (bpm): 151 ([place])   Movement: Present  Presentation: Vertex  General:  Alert, oriented and cooperative. Patient is in no acute distress.  Skin: Skin is warm and dry. No rash noted.   Cardiovascular: Normal heart rate noted  Respiratory: Normal respiratory effort, no problems with respiration noted  Abdomen: Soft, gravid, appropriate for gestational age. Pain/Pressure:  Present     Pelvic:  Cervical exam deferred        Extremities: Normal range of motion.     Mental Status: Normal mood and affect. Normal behavior. Normal judgment and thought content.   Bedside u/s to show fetal movement when she is not feeling it.   Assessment   29 y.o. G2P0101 at [redacted]w[redacted]d by  09/15/2019, by Last Menstrual Period presenting for routine prenatal visit  Plan   pregnancy Problems (from 01/28/19 to present)    Problem Noted Resolved   Vaginal bleeding during pregnancy 06/10/2019 by Conard Novak, MD No   Placenta previa antepartum 04/20/2019 by Conard Novak, MD No   Overview Signed 06/10/2019  9:15 AM by Conard Novak, MD    Resolved at 26 weeks      Constipation during pregnancy 02/14/2019 by Conard Novak, MD No   Constipation 01/28/2019 by Conard Novak, MD No   Supervision of high risk pregnancy, antepartum 01/28/2019 by Conard Novak, MD No   Overview Addendum 06/20/2019 11:31 AM by Conard Novak, MD    [facility] Prenatal Labs  Dating L=7 Blood type: B/Positive/-- (01/15 1255)   Genetic Screen 1 Screen:    AFP:     Quad:     NIPS: Antibody:Negative (01/15 1255)  Anatomic [place] complete Rubella: 9.10 (01/15 1255)  Varicella: Immune  GTT Third trimester: [ ]  RPR: Non Reactive (01/15 1255)   Rhogam n/a HBsAg: Negative (01/15 1255)   TDaP vaccine                       Flu Shot: HIV: Non Reactive (01/15 1255)   Baby Food                                GBS:  Contraception  Pap:  CBB     CS/VBAC    Support Person Leonard Schwartz          History of preterm delivery, currently pregnant 01/28/2019 by Will Bonnet, MD No   Overview Addendum 06/20/2019 11:31 AM by Will Bonnet, MD    [x ] progesterone vaginal suppositories [x]  16 week cervical length u/s          Preterm labor symptoms and general obstetric precautions including but not limited to vaginal bleeding, contractions, leaking of fluid and fetal movement were  reviewed in detail with the patient. Please refer to After Visit Summary for other counseling recommendations.   - discussed pelvic exam for sx. U/s reassuring. Will defer pelvic exam today given reassuring u/s findings. She states she is comfortable with that. Low threshold to check in the future.  Briefly discussed symptomatic tx of contractions (nifedipine).  - weekly visits for now given level of symptoms.  - 28 week labs today  Return in about 1 week (around 06/27/2019) for Routine Prenatal Appointment (SDJ, OK to over/double book).  Prentice Docker, MD, [facility], [facility] 06/20/2019 11:50 AM

## 2019-06-21 LAB — 28 WEEK RH+PANEL
Basophils Absolute: 0 10*3/uL (ref 0.0–0.2)
Basos: 0 %
EOS (ABSOLUTE): 0 10*3/uL (ref 0.0–0.4)
Eos: 0 %
Gestational Diabetes Screen: 129 mg/dL (ref 65–139)
HIV Screen 4th Generation wRfx: NONREACTIVE
Hematocrit: 36.1 % (ref 34.0–46.6)
Hemoglobin: 12.2 g/dL (ref 11.1–15.9)
Immature Grans (Abs): 0 10*3/uL (ref 0.0–0.1)
Immature Granulocytes: 0 %
Lymphocytes Absolute: 1.5 10*3/uL (ref 0.7–3.1)
Lymphs: 21 %
MCH: 31 pg (ref 26.6–33.0)
MCHC: 33.8 g/dL (ref 31.5–35.7)
MCV: 92 fL (ref 79–97)
Monocytes Absolute: 0.3 10*3/uL (ref 0.1–0.9)
Monocytes: 4 %
Neutrophils Absolute: 5.4 10*3/uL (ref 1.4–7.0)
Neutrophils: 75 %
Platelets: 232 10*3/uL (ref 150–450)
RBC: 3.94 x10E6/uL (ref 3.77–5.28)
RDW: 11.8 % (ref 11.7–15.4)
RPR Ser Ql: NONREACTIVE
WBC: 7.2 10*3/uL (ref 3.4–10.8)

## 2019-06-27 ENCOUNTER — Encounter: Payer: Self-pay | Admitting: Obstetrics and Gynecology

## 2019-06-27 ENCOUNTER — Other Ambulatory Visit: Payer: Self-pay

## 2019-06-27 ENCOUNTER — Ambulatory Visit (INDEPENDENT_AMBULATORY_CARE_PROVIDER_SITE_OTHER): Payer: 59 | Admitting: Obstetrics and Gynecology

## 2019-06-27 VITALS — BP 90/60 | Wt 149.0 lb

## 2019-06-27 DIAGNOSIS — O0990 Supervision of high risk pregnancy, unspecified, unspecified trimester: Secondary | ICD-10-CM

## 2019-06-27 DIAGNOSIS — Z3A28 28 weeks gestation of pregnancy: Secondary | ICD-10-CM

## 2019-06-27 DIAGNOSIS — O09899 Supervision of other high risk pregnancies, unspecified trimester: Secondary | ICD-10-CM

## 2019-06-27 DIAGNOSIS — O4693 Antepartum hemorrhage, unspecified, third trimester: Secondary | ICD-10-CM

## 2019-06-27 DIAGNOSIS — O09893 Supervision of other high risk pregnancies, third trimester: Secondary | ICD-10-CM

## 2019-06-27 DIAGNOSIS — O4403 Placenta previa specified as without hemorrhage, third trimester: Secondary | ICD-10-CM

## 2019-06-27 DIAGNOSIS — O0993 Supervision of high risk pregnancy, unspecified, third trimester: Secondary | ICD-10-CM

## 2019-06-27 DIAGNOSIS — O4690 Antepartum hemorrhage, unspecified, unspecified trimester: Secondary | ICD-10-CM

## 2019-06-27 DIAGNOSIS — O4400 Placenta previa specified as without hemorrhage, unspecified trimester: Secondary | ICD-10-CM

## 2019-06-27 LAB — POCT URINALYSIS DIPSTICK OB
Glucose, UA: NEGATIVE
POC,PROTEIN,UA: NEGATIVE

## 2019-06-27 NOTE — Progress Notes (Signed)
Routine Prenatal Care Visit  Subjective  Dorothy Townsend is a 29 y.o. G2P0101 at [redacted]w[redacted]d being seen today for ongoing prenatal care.  She is currently monitored for the following issues for this high-risk pregnancy and has GAD (generalized anxiety disorder); Irritable bowel syndrome with constipation; Screening cholesterol level; Other fatigue; Constipation; Supervision of high risk pregnancy, antepartum; History of preterm delivery, currently pregnant; Constipation during pregnancy; Placenta previa antepartum; and Vaginal bleeding during pregnancy on their problem list.  ----------------------------------------------------------------------------------- Patient reports still having strong cramps that reveal a lot of pressure in her pelvis.   Contractions: Irritability. Vag. Bleeding: None.  Movement: Present. Leaking Fluid denies.  ----------------------------------------------------------------------------------- The following portions of the patient's history were reviewed and updated as appropriate: allergies, current medications, past family history, past medical history, past social history, past surgical history and problem list. Problem list updated.  Objective  Blood pressure 90/60, weight 149 lb (67.6 kg), last menstrual period 12/09/2018. Pregravid weight 131 lb (59.4 kg) Total Weight Gain 18 lb (8.165 kg) Urinalysis: Urine Protein Negative  Urine Glucose Negative  Fetal Status: Fetal Heart Rate (bpm): 140 Fundal Height: 28 cm Movement: Present     General:  Alert, oriented and cooperative. Patient is in no acute distress.  Skin: Skin is warm and dry. No rash noted.   Cardiovascular: Normal heart rate noted  Respiratory: Normal respiratory effort, no problems with respiration noted  Abdomen: Soft, gravid, appropriate for gestational age. Pain/Pressure: Present     Pelvic:  Cervical exam performed Dilation: Closed     (visualized only with speculum, no digital exam)  Extremities:  Normal range of motion.     Mental Status: Normal mood and affect. Normal behavior. Normal judgment and thought content.   Assessment   29 y.o. G2P0101 at [redacted]w[redacted]d by  09/15/2019, by Last Menstrual Period presenting for routine prenatal visit  Plan   pregnancy Problems (from 01/28/19 to present)    Problem Noted Resolved   Vaginal bleeding during pregnancy 06/10/2019 by Conard Novak, MD No   Placenta previa antepartum 04/20/2019 by Conard Novak, MD No   Overview Signed 06/10/2019  9:15 AM by Conard Novak, MD    Resolved at 26 weeks      Constipation during pregnancy 02/14/2019 by Conard Novak, MD No   Constipation 01/28/2019 by Conard Novak, MD No   Supervision of high risk pregnancy, antepartum 01/28/2019 by Conard Novak, MD No   Overview Addendum 06/20/2019 11:31 AM by Conard Novak, MD    Clinic [facility] Prenatal Labs  Dating L=7 Blood type: B/Positive/-- (01/15 1255)   Genetic Screen 1 Screen:    AFP:     Quad:     NIPS: Antibody:Negative (01/15 1255)  Anatomic [place] complete Rubella: 9.10 (01/15 1255)  Varicella: Immune  GTT Third trimester: [ ]  RPR: Non Reactive (01/15 1255)   Rhogam n/a HBsAg: Negative (01/15 1255)   TDaP vaccine                       Flu Shot: HIV: Non Reactive (01/15 1255)   Baby Food                                GBS:   Contraception  Pap:  CBB     CS/VBAC    Support Person 02-21-1982           Previous Version  History of preterm delivery, currently pregnant 01/28/2019 by Will Bonnet, MD No   Overview Addendum 06/20/2019 11:31 AM by Will Bonnet, MD    [x ] progesterone vaginal suppositories [x]  16 week cervical length u/s      Previous Version       Preterm labor symptoms and general obstetric precautions including but not limited to vaginal bleeding, contractions, leaking of fluid and fetal movement were reviewed in detail with the patient. Please refer to After Visit Summary for other counseling  recommendations.   Return in about 2 weeks (around 07/11/2019) for Routine Prenatal Appointment.  Prentice Docker, MD, [facility], [facility] 06/27/2019 5:46 PM

## 2019-07-05 ENCOUNTER — Encounter: Payer: Self-pay | Admitting: Obstetrics and Gynecology

## 2019-07-05 ENCOUNTER — Observation Stay
Admission: EM | Admit: 2019-07-05 | Discharge: 2019-07-05 | Disposition: A | Discharge status: Home / Self Care | Payer: 59 | Attending: Obstetrics and Gynecology | Admitting: Obstetrics and Gynecology

## 2019-07-05 ENCOUNTER — Other Ambulatory Visit: Payer: Self-pay

## 2019-07-05 ENCOUNTER — Observation Stay: Payer: 59

## 2019-07-05 DIAGNOSIS — O99612 Diseases of the digestive system complicating pregnancy, second trimester: Secondary | ICD-10-CM

## 2019-07-05 DIAGNOSIS — O09213 Supervision of pregnancy with history of pre-term labor, third trimester: Secondary | ICD-10-CM | POA: Insufficient documentation

## 2019-07-05 DIAGNOSIS — Z3A29 29 weeks gestation of pregnancy: Secondary | ICD-10-CM | POA: Diagnosis not present

## 2019-07-05 DIAGNOSIS — O4693 Antepartum hemorrhage, unspecified, third trimester: Secondary | ICD-10-CM | POA: Diagnosis not present

## 2019-07-05 DIAGNOSIS — O4400 Placenta previa specified as without hemorrhage, unspecified trimester: Secondary | ICD-10-CM

## 2019-07-05 DIAGNOSIS — O4690 Antepartum hemorrhage, unspecified, unspecified trimester: Secondary | ICD-10-CM

## 2019-07-05 DIAGNOSIS — K5900 Constipation, unspecified: Secondary | ICD-10-CM

## 2019-07-05 DIAGNOSIS — Z3492 Encounter for supervision of normal pregnancy, unspecified, second trimester: Secondary | ICD-10-CM

## 2019-07-05 DIAGNOSIS — O09899 Supervision of other high risk pregnancies, unspecified trimester: Secondary | ICD-10-CM

## 2019-07-05 DIAGNOSIS — O0990 Supervision of high risk pregnancy, unspecified, unspecified trimester: Secondary | ICD-10-CM

## 2019-07-05 DIAGNOSIS — K5909 Other constipation: Secondary | ICD-10-CM

## 2019-07-05 NOTE — Progress Notes (Signed)
Pt in ultrasound upon RN arriving for 7p-7a shift.

## 2019-07-05 NOTE — Discharge Summary (Signed)
See Final Progress Note 

## 2019-07-05 NOTE — Final Progress Note (Signed)
Physician Final Progress Note  Patient ID: Dorothy Townsend MRN: [id number] DOB/AGE: 08-Nov-1990 29 y.o.  Admit date: 07/05/2019 Admitting provider: [name], MD  Discharge date: 07/05/2019   Admission Diagnoses:  1) Vaginal bleeding in pregnancy, third trimester 2) History of preterm delivery, currently pregnant 3) Supervision of high risk pregnancy, third trimester 4) [redacted] weeks gestation  Discharge Diagnoses:  Principal Problem:   Vaginal bleeding in pregnancy, third trimester Active Problems:   Supervision of high risk pregnancy, antepartum   History of preterm delivery, currently pregnant   History of Present Illness: The patient is a 29 y.o. female G2P0101 at [redacted]w[redacted]d who presents for vaginal bleeding. She noted blood this morning that was initially brown.  She then noted bright red bleeding that continued and she noted it when she wiped.  She is unsure of whether she continues to bleed.  She notes +FM, denies a big gush of fluid.  She denies contractions, but has continued to have cramping. She takes vaginal progesterone due to her history of preterm delivery at 36 weeks with G1.  She has had other episodes of spotting.  But, this has been the largest amount.  She has other discharge related to her suppository. She otherwise denies any vaginal symptoms, unless noted above.   Past Medical History:  Diagnosis Date  . Acne   . ADD (attention deficit disorder)   . ADHD (attention deficit hyperactivity disorder)    history of  . Anxiety    history of  . Constipation   . Immunization, viral disease    GARDASIL COMPLETED  . Personal history of kidney stones     Past Surgical History:  Procedure Laterality Date  . EXCISION OF KELOID    . EXTERNAL EAR SURGERY Bilateral 2012   NODULES REMOVED   . TONSILLECTOMY  2015  . WISDOM TOOTH EXTRACTION      No current facility-administered medications on file prior to encounter.   Current Outpatient Medications on File Prior to  Encounter  Medication Sig Dispense Refill  . Prenatal Vit-Fe Fumarate-FA (PRENATAL VITAMIN PO) Take by mouth.    . progesterone 200 MG SUPP Place 1 suppository (200 mg total) vaginally at bedtime. 30 suppository 5  . hydroxyprogesterone caproate (MAKENA) 250 mg/mL OIL injection Inject 1 mL (250 mg total) into the muscle once for 1 dose. 1 mL 20  . lactulose (CHRONULAC) 10 GM/15ML solution Take 15 mLs (10 g total) by mouth 2 (two) times daily. (Patient not taking: Reported on 07/05/2019) 946 mL 2    Allergies  Allergen Reactions  . Fluvoxamine Anxiety    Social History   Socioeconomic History  . Marital status: Married    Spouse name: Not on file  . Number of children: 0  . Years of education: 78  . Highest education level: Not on file  Occupational History  . Occupation: Stylist    Comment: [employer]  Tobacco Use  . Smoking status: Never Smoker  . Smokeless tobacco: Never Used  Vaping Use  . Vaping Use: Never used  Substance and Sexual Activity  . Alcohol use: Yes    Alcohol/week: 0.0 standard drinks    Comment: seldom  . Drug use: No  . Sexual activity: Yes    Partners: Male    Birth control/protection: None  Other Topics Concern  . Not on file  Social History Narrative  . Not on file   Social Determinants of Health   Financial Resource Strain:   . Difficulty of Paying  Living Expenses:   Food Insecurity:   . Worried About Programme researcher, broadcasting/film/video in the Last Year:   . Barista in the Last Year:   Transportation Needs:   . Freight forwarder (Medical):   Marland Kitchen Lack of Transportation (Non-Medical):   Physical Activity:   . Days of Exercise per Week:   . Minutes of Exercise per Session:   Stress:   . Feeling of Stress :   Social Connections:   . Frequency of Communication with Friends and Family:   . Frequency of Social Gatherings with Friends and Family:   . Attends Religious Services:   . Active Member of Clubs or Organizations:   . Attends Tax inspector Meetings:   Marland Kitchen Marital Status:   Intimate Partner Violence:   . Fear of Current or Ex-Partner:   . Emotionally Abused:   Marland Kitchen Physically Abused:   . Sexually Abused:     Family History  Problem Relation Age of Onset  . Diabetes Maternal Grandfather   . Cancer Other        UNKNOWN AGE/TYPE-THINKS THROAT     Review of Systems  Constitutional: Negative.   HENT: Negative.   Eyes: Negative.   Respiratory: Negative.   Cardiovascular: Negative.   Gastrointestinal: Negative.   Genitourinary: Negative.   Musculoskeletal: Negative.   Skin: Negative.   Neurological: Negative.   Psychiatric/Behavioral: Negative.      Physical Exam: BP 123/66 (BP Location: Left Arm)   Pulse 87   Temp 98.4 F (36.9 C) (Oral)   Resp 16   Ht 5\' 1"  (1.549 m)   Wt 65.3 kg   LMP 12/09/2018   BMI 27.21 kg/m   Physical Exam Constitutional:      General: She is not in acute distress.    Appearance: Normal appearance. She is well-developed.  Genitourinary:     Pelvic exam was performed with patient in the lithotomy position.     Vulva, inguinal canal, urethra, bladder, vagina, right adnexa and left adnexa normal.     No vulval lesion noted.     No posterior fourchette tenderness, injury or lesion present.     No cervical friability, lesion, bleeding or polyp.     Uterus exam comments: gravid.     Genitourinary Comments: SVE: closed internal os (external os slightly open)/50/high  HENT:     Head: Normocephalic and atraumatic.  Eyes:     General: No scleral icterus.    Conjunctiva/sclera: Conjunctivae normal.  Cardiovascular:     Rate and Rhythm: Normal rate and regular rhythm.     Heart sounds: No murmur heard.  No friction rub. No gallop.   Pulmonary:     Effort: Pulmonary effort is normal. No respiratory distress.     Breath sounds: Normal breath sounds. No wheezing or rales.  Abdominal:     General: Bowel sounds are normal. There is no distension.     Palpations: Abdomen is  soft. There is no mass.     Tenderness: There is no abdominal tenderness. There is no guarding or rebound.  Musculoskeletal:        General: Normal range of motion.     Cervical back: Normal range of motion and neck supple.  Neurological:     General: No focal deficit present.     Mental Status: She is alert and oriented to person, place, and time.     Cranial Nerves: No cranial nerve deficit.  Skin:    General:  Skin is warm and dry.     Findings: No erythema.  Psychiatric:        Mood and Affect: Mood normal.        Behavior: Behavior normal.        Judgment: Judgment normal.   Female chaperone present for pelvic exam:   Consults: None  Significant Findings/ Diagnostic Studies:  Imaging Results US OB Limited  Result Date: 07/05/2019 CLINICAL DATA:  Vaginal bleeding third trimester EXAM: LIMITED OBSTETRIC ULTRASOUND AND TRANSVAGINAL OBSTETRIC ULTRASOUND FINDINGS: Number of Fetuses: 1 Heart Rate:  139 bpm Movement: Yes Presentation: Cephalic Placental Location: Posterior Previa: No Amniotic Fluid (Subjective): Within normal limits. AFI: 15.5 cm FL:  5.7cm 29w 6d MATERNAL FINDINGS: Cervix: Appears closed. Cervical length of 3.3 cm by endovaginal images. Uterus/Adnexae:  No abnormality visualized. IMPRESSION: 1. Single viable intrauterine pregnancy. Negative for previa or retroplacental fluid collection/hematoma 2. Closed cervix with cervical length of 3.3 cm This exam is performed on an emergent basis and does not comprehensively evaluate fetal size, dating, or anatomy; follow-up complete OB US should be considered if further fetal assessment is warranted. Electronically Signed   By: Donavan Foil M.D.   On: 07/05/2019 20:05   US OB Transvaginal  Result Date: 07/05/2019 CLINICAL DATA:  Vaginal bleeding third trimester EXAM: LIMITED OBSTETRIC ULTRASOUND AND TRANSVAGINAL OBSTETRIC ULTRASOUND FINDINGS: Number of Fetuses: 1 Heart Rate:  139 bpm Movement: Yes Presentation: Cephalic Placental  Location: Posterior Previa: No Amniotic Fluid (Subjective): Within normal limits. AFI: 15.5 cm FL:  5.7cm 29w 6d MATERNAL FINDINGS: Cervix: Appears closed. Cervical length of 3.3 cm by endovaginal images. Uterus/Adnexae:  No abnormality visualized. IMPRESSION: 1. Single viable intrauterine pregnancy. Negative for previa or retroplacental fluid collection/hematoma 2. Closed cervix with cervical length of 3.3 cm This exam is performed on an emergent basis and does not comprehensively evaluate fetal size, dating, or anatomy; follow-up complete OB US should be considered if further fetal assessment is warranted. Electronically Signed   By: Donavan Foil M.D.   On: 07/05/2019 20:05    Procedures:  NST: Baseline FHR: 135 beats/min Variability: moderate Accelerations: present Decelerations: absent Tocometry: rare   Interpretation:  INDICATIONS: rule out uterine contractions RESULTS:  A NST procedure was performed with FHR monitoring and a normal baseline established, appropriate time of 20-40 minutes of evaluation, and accels >2 seen w 15x15 characteristics.  Results show a REACTIVE NST.    Hospital Course: The patient was admitted to Labor and Delivery Triage for observation. She had a normal pelvic exam. There was no blood in the vaginal vault on exam.  Her cervix was closed.  Her vitals were normal. The fetal tracing was reassuring for gestational age.  I performed a bedside ultrasound. The fetus was appropriately grown with subjectively normal fluid. No obvious abruption.  A formal ultrasound was obtained, as I could not visualize the peri-cervical area.  This exam was reassuring and no abruption of hematoma was noted.  Given all the normal reassuring findings, the patient was discharged in stable condition with bleeding precautions.    Discharge Condition: stable  Disposition: Discharge disposition: 01-Home or Self Care       Diet: Regular diet  Discharge Activity: Activity as tolerated and  pelvic rest   Allergies as of 07/05/2019      Reactions   Fluvoxamine Anxiety      Medication List    STOP taking these medications   hydroxyprogesterone caproate 250 mg/mL Oil injection Commonly known as: Makena   lactulose  10 GM/15ML solution Commonly known as: CHRONULAC     TAKE these medications   PRENATAL VITAMIN PO Take by mouth.   progesterone 200 MG Supp Place 1 suppository (200 mg total) vaginally at bedtime.        Total time spent taking care of this patient: 45 minutes  Signed: [name], MD  07/05/2019, 8:25 PM

## 2019-07-05 NOTE — Telephone Encounter (Signed)
Carmen in L&D aware.

## 2019-07-05 NOTE — Progress Notes (Signed)
Pt back to the floor at 7:42p. Shift assessment completed. Awaiting ultrasound results

## 2019-07-13 ENCOUNTER — Other Ambulatory Visit: Payer: Self-pay

## 2019-07-13 ENCOUNTER — Encounter: Payer: Self-pay | Admitting: Obstetrics and Gynecology

## 2019-07-13 ENCOUNTER — Ambulatory Visit (INDEPENDENT_AMBULATORY_CARE_PROVIDER_SITE_OTHER): Payer: 59 | Admitting: Obstetrics and Gynecology

## 2019-07-13 VITALS — BP 118/74 | Wt 151.0 lb

## 2019-07-13 DIAGNOSIS — Z23 Encounter for immunization: Secondary | ICD-10-CM | POA: Diagnosis not present

## 2019-07-13 DIAGNOSIS — O4400 Placenta previa specified as without hemorrhage, unspecified trimester: Secondary | ICD-10-CM

## 2019-07-13 DIAGNOSIS — O4693 Antepartum hemorrhage, unspecified, third trimester: Secondary | ICD-10-CM

## 2019-07-13 DIAGNOSIS — Z3A30 30 weeks gestation of pregnancy: Secondary | ICD-10-CM

## 2019-07-13 DIAGNOSIS — O0993 Supervision of high risk pregnancy, unspecified, third trimester: Secondary | ICD-10-CM

## 2019-07-13 DIAGNOSIS — O09899 Supervision of other high risk pregnancies, unspecified trimester: Secondary | ICD-10-CM

## 2019-07-13 DIAGNOSIS — O0990 Supervision of high risk pregnancy, unspecified, unspecified trimester: Secondary | ICD-10-CM

## 2019-07-13 LAB — POCT URINALYSIS DIPSTICK OB
Glucose, UA: NEGATIVE
POC,PROTEIN,UA: NEGATIVE

## 2019-07-13 NOTE — Progress Notes (Signed)
Routine Prenatal Care Visit  Subjective  Dorothy Townsend is a 29 y.o. G2P0101 at [redacted]w[redacted]d being seen today for ongoing prenatal care.  She is currently monitored for the following issues for this high-risk pregnancy and has GAD (generalized anxiety disorder); Irritable bowel syndrome with constipation; Screening cholesterol level; Other fatigue; Constipation; Supervision of high risk pregnancy, antepartum; History of preterm delivery, currently pregnant; Constipation during pregnancy; Placenta previa antepartum; Vaginal bleeding during pregnancy; and Vaginal bleeding in pregnancy, third trimester on their problem list.  ----------------------------------------------------------------------------------- Patient reports continued increased discharge in the morning mixed with bright red streaking in the discharge.  Over the past week this has been the case and she has scant bright-red blood tinged discharge during the day. She still has mild cramping/contractions with the occasional strong contraction. Recently she had a contraction that was so strong that she lost continence.     Contractions: Irritability. Vag. Bleeding: Scant, Small.  Movement: Present. Leaking Fluid denies.  ----------------------------------------------------------------------------------- The following portions of the patient's history were reviewed and updated as appropriate: allergies, current medications, past family history, past medical history, past social history, past surgical history and problem list. Problem list updated.  Objective  Blood pressure 118/74, weight 151 lb (68.5 kg), last menstrual period 12/09/2018. Pregravid weight 131 lb (59.4 kg) Total Weight Gain 20 lb (9.072 kg) Urinalysis: Urine Protein Negative  Urine Glucose Negative  Fetal Status: Fetal Heart Rate (bpm): 140 Fundal Height: 30 cm Movement: Present     General:  Alert, oriented and cooperative. Patient is in no acute distress.  Skin: Skin is  warm and dry. No rash noted.   Cardiovascular: Normal heart rate noted  Respiratory: Normal respiratory effort, no problems with respiration noted  Abdomen: Soft, gravid, appropriate for gestational age. Pain/Pressure: Present     Pelvic:  Cervical exam performed Dilation: Closed Effacement (%): 50 Station: Ballotable no visible bleeding on SSE.   Extremities: Normal range of motion.  Edema: None  Mental Status: Normal mood and affect. Normal behavior. Normal judgment and thought content.   Female chaperone present for pelvic exam:   Assessment   29 y.o. G2P0101 at [redacted]w[redacted]d by  09/15/2019, by Last Menstrual Period presenting for routine prenatal visit  Plan   pregnancy Problems (from 01/28/19 to present)    Problem Noted Resolved   Vaginal bleeding during pregnancy 06/10/2019 by Conard Novak, MD No   Placenta previa antepartum 04/20/2019 by Conard Novak, MD No   Overview Signed 06/10/2019  9:15 AM by Conard Novak, MD    Resolved at 26 weeks      Constipation during pregnancy 02/14/2019 by Conard Novak, MD No   Constipation 01/28/2019 by Conard Novak, MD No   Supervision of high risk pregnancy, antepartum 01/28/2019 by Conard Novak, MD No   Overview Addendum 06/20/2019 11:31 AM by Conard Novak, MD    Clinic [facility] Prenatal Labs  Dating L=7 Blood type: B/Positive/-- (01/15 1255)   Genetic Screen 1 Screen:    AFP:     Quad:     NIPS: Antibody:Negative (01/15 1255)  Anatomic [place] complete Rubella: 9.10 (01/15 1255)  Varicella: Immune  GTT Third trimester: [ ]  RPR: Non Reactive (01/15 1255)   Rhogam n/a HBsAg: Negative (01/15 1255)   TDaP vaccine                       Flu Shot: HIV: Non Reactive (01/15 1255)   Baby Food  GBS:   Contraception  Pap:  CBB     CS/VBAC    Support Person Azzie Glatter           Previous Version   History of preterm delivery, currently pregnant 01/28/2019 by Conard Novak, MD No    Overview Addendum 06/20/2019 11:31 AM by Conard Novak, MD    [x ] progesterone vaginal suppositories [x]  16 week cervical length u/s      Previous Version       Preterm labor symptoms and general obstetric precautions including but not limited to vaginal bleeding, contractions, leaking of fluid and fetal movement were reviewed in detail with the patient. Please refer to After Visit Summary for other counseling recommendations.   -Long discussion about bleeding.  Again discussed that this could be a very mild chronic abruption vs irritation of the cervix/vagina from the suppository.  Discussed timing of steroids, if indicated. At this point my threshold is low to give steroids. However, she has been stable and does not have an indication that she will deliver within the next 7 days, which is the recommended timing per ACOG.  We discussed that she is welcome to be in touch with me by text so that we can monitor changes more in real time.  For any significant change will go ahead and give BMTZ.  Discussed that if she labored prior to [redacted]w[redacted]d she would get magnesium and steroids and possible tocolysis, if bleeding not heavy.  I would not tocolyze her if her bleeding is heavy.  She and her husband were both present for this discussion. All questions answered.   Return in about 1 week (around 07/20/2019) for Routine Prenatal Appointment with SDJ.  09/20/2019, MD, [name] [facility], [facility] 07/13/2019 12:36 PM

## 2019-07-19 ENCOUNTER — Encounter: Payer: Self-pay | Admitting: Obstetrics and Gynecology

## 2019-07-19 ENCOUNTER — Ambulatory Visit (INDEPENDENT_AMBULATORY_CARE_PROVIDER_SITE_OTHER): Payer: 59 | Admitting: Obstetrics and Gynecology

## 2019-07-19 ENCOUNTER — Other Ambulatory Visit: Payer: Self-pay

## 2019-07-19 VITALS — BP 122/70 | Wt 153.0 lb

## 2019-07-19 DIAGNOSIS — O09899 Supervision of other high risk pregnancies, unspecified trimester: Secondary | ICD-10-CM

## 2019-07-19 DIAGNOSIS — O0993 Supervision of high risk pregnancy, unspecified, third trimester: Secondary | ICD-10-CM

## 2019-07-19 DIAGNOSIS — O4402 Placenta previa specified as without hemorrhage, second trimester: Secondary | ICD-10-CM

## 2019-07-19 DIAGNOSIS — Z3A31 31 weeks gestation of pregnancy: Secondary | ICD-10-CM

## 2019-07-19 DIAGNOSIS — O4693 Antepartum hemorrhage, unspecified, third trimester: Secondary | ICD-10-CM

## 2019-07-19 NOTE — Progress Notes (Signed)
Routine Prenatal Care Visit  Subjective  Dorothy Townsend is a 29 y.o. G2P0101 at [redacted]w[redacted]d being seen today for ongoing prenatal care.  She is currently monitored for the following issues for this high-risk pregnancy and has GAD (generalized anxiety disorder); Irritable bowel syndrome with constipation; Screening cholesterol level; Other fatigue; Constipation; Supervision of high risk pregnancy, antepartum; History of preterm delivery, currently pregnant; Constipation during pregnancy; Placenta previa antepartum; Vaginal bleeding during pregnancy; and Vaginal bleeding in pregnancy, third trimester on their problem list.  ----------------------------------------------------------------------------------- Patient reports heartburn. No bleeding for the past 2 days. Prior to that her symptoms have been like before.   Contractions: Irritability. Vag. Bleeding: Scant.  Movement: Present. Leaking Fluid denies.  ----------------------------------------------------------------------------------- The following portions of the patient's history were reviewed and updated as appropriate: allergies, current medications, past family history, past medical history, past social history, past surgical history and problem list. Problem list updated.  Objective  Blood pressure 122/70, weight 153 lb (69.4 kg), last menstrual period 12/09/2018. Pregravid weight 131 lb (59.4 kg) Total Weight Gain 22 lb (9.979 kg) Urinalysis: Urine Protein    Urine Glucose    Fetal Status: Fetal Heart Rate (bpm): 135 Fundal Height: 31 cm Movement: Present     General:  Alert, oriented and cooperative. Patient is in no acute distress.  Skin: Skin is warm and dry. No rash noted.   Cardiovascular: Normal heart rate noted  Respiratory: Normal respiratory effort, no problems with respiration noted  Abdomen: Soft, gravid, appropriate for gestational age. Pain/Pressure: Present     Pelvic:  Cervical exam deferred        Extremities: Normal  range of motion.  Edema: None  Mental Status: Normal mood and affect. Normal behavior. Normal judgment and thought content.   Assessment   29 y.o. G2P0101 at [redacted]w[redacted]d by  09/15/2019, by Last Menstrual Period presenting for routine prenatal visit  Plan   pregnancy Problems (from 01/28/19 to present)    Problem Noted Resolved   Vaginal bleeding during pregnancy 06/10/2019 by Conard Novak, MD No   Placenta previa antepartum 04/20/2019 by Conard Novak, MD No   Overview Signed 06/10/2019  9:15 AM by Conard Novak, MD    Resolved at 27 weeks      Constipation during pregnancy 02/14/2019 by Conard Novak, MD No   Constipation 01/28/2019 by Conard Novak, MD No   Supervision of high risk pregnancy, antepartum 01/28/2019 by Conard Novak, MD No   Overview Addendum 06/20/2019 11:31 AM by Conard Novak, MD    [facility]inic [facility] Prenatal Labs  Dating L=7 Blood type: B/Positive/-- (01/15 1255)   Genetic Screen 1 Screen:    AFP:     Quad:     NIPS: Antibody:Negative (01/15 1255)  Anatomic [place] complete Rubella: 9.10 (01/15 1255)  Varicella: Immune  GTT Third trimester: [ ]  RPR: Non Reactive (01/15 1255)   Rhogam n/a HBsAg: Negative (01/15 1255)   TDaP vaccine                       Flu Shot: HIV: Non Reactive (01/15 1255)   Baby Food                                GBS:   Contraception  Pap:  CBB     CS/VBAC    Support Person [place], [place]  Previous Version   History of preterm delivery, currently pregnant 01/28/2019 by Conard Novak, MD No   Overview Addendum 06/20/2019 11:31 AM by Conard Novak, MD    [x ] progesterone vaginal suppositories [x]  16 week cervical length u/s      Previous Version       Preterm labor symptoms and general obstetric precautions including but not limited to vaginal bleeding, contractions, leaking of fluid and fetal movement were reviewed in detail with the patient. Please refer to After Visit Summary for other  counseling recommendations.   Return in about 1 week (around 07/26/2019) for Routine Prenatal Appointment (may make multiple weekly appointments with Dr. 07/28/2019).  Jean Rosenthal, MD, [name], [facility] 07/19/2019 3:36 PM

## 2019-07-28 ENCOUNTER — Other Ambulatory Visit: Payer: Self-pay

## 2019-07-28 ENCOUNTER — Encounter: Payer: Self-pay | Admitting: Obstetrics and Gynecology

## 2019-07-28 ENCOUNTER — Inpatient Hospital Stay
Admission: EM | Admit: 2019-07-28 | Discharge: 2019-07-31 | DRG: 833 | Disposition: A | Discharge status: Home / Self Care | Payer: 59 | Attending: Certified Nurse Midwife | Admitting: Certified Nurse Midwife

## 2019-07-28 ENCOUNTER — Ambulatory Visit (INDEPENDENT_AMBULATORY_CARE_PROVIDER_SITE_OTHER): Payer: 59 | Admitting: Obstetrics and Gynecology

## 2019-07-28 VITALS — BP 143/81 | HR 112 | Wt 154.0 lb

## 2019-07-28 DIAGNOSIS — O368230 Fetal anemia and thrombocytopenia, third trimester, not applicable or unspecified: Secondary | ICD-10-CM

## 2019-07-28 DIAGNOSIS — O4693 Antepartum hemorrhage, unspecified, third trimester: Secondary | ICD-10-CM | POA: Diagnosis not present

## 2019-07-28 DIAGNOSIS — O99820 Streptococcus B carrier state complicating pregnancy: Secondary | ICD-10-CM | POA: Diagnosis present

## 2019-07-28 DIAGNOSIS — Z3A33 33 weeks gestation of pregnancy: Secondary | ICD-10-CM

## 2019-07-28 DIAGNOSIS — O0990 Supervision of high risk pregnancy, unspecified, unspecified trimester: Secondary | ICD-10-CM

## 2019-07-28 DIAGNOSIS — O09899 Supervision of other high risk pregnancies, unspecified trimester: Secondary | ICD-10-CM

## 2019-07-28 DIAGNOSIS — O4593 Premature separation of placenta, unspecified, third trimester: Secondary | ICD-10-CM | POA: Diagnosis not present

## 2019-07-28 DIAGNOSIS — K5909 Other constipation: Secondary | ICD-10-CM

## 2019-07-28 DIAGNOSIS — Z20822 Contact with and (suspected) exposure to covid-19: Secondary | ICD-10-CM | POA: Diagnosis present

## 2019-07-28 DIAGNOSIS — O4690 Antepartum hemorrhage, unspecified, unspecified trimester: Secondary | ICD-10-CM

## 2019-07-28 DIAGNOSIS — O4590 Premature separation of placenta, unspecified, unspecified trimester: Secondary | ICD-10-CM

## 2019-07-28 DIAGNOSIS — O4400 Placenta previa specified as without hemorrhage, unspecified trimester: Secondary | ICD-10-CM

## 2019-07-28 DIAGNOSIS — K5900 Constipation, unspecified: Secondary | ICD-10-CM

## 2019-07-28 DIAGNOSIS — O99612 Diseases of the digestive system complicating pregnancy, second trimester: Secondary | ICD-10-CM

## 2019-07-28 LAB — COMPREHENSIVE METABOLIC PANEL
ALT: 13 U/L (ref 0–44)
AST: 18 U/L (ref 15–41)
Albumin: 3.3 g/dL — ABNORMAL LOW (ref 3.5–5.0)
Alkaline Phosphatase: 92 U/L (ref 38–126)
Anion gap: 6 (ref 5–15)
BUN: 5 mg/dL — ABNORMAL LOW (ref 6–20)
CO2: 26 mmol/L (ref 22–32)
Calcium: 8.7 mg/dL — ABNORMAL LOW (ref 8.9–10.3)
Chloride: 103 mmol/L (ref 98–111)
Creatinine, Ser: 0.37 mg/dL — ABNORMAL LOW (ref 0.44–1.00)
GFR calc Af Amer: >60 mL/min (ref >60)
GFR calc non Af Amer: >60 mL/min (ref >60)
Glucose, Bld: 83 mg/dL (ref 70–99)
Potassium: 3.8 mmol/L (ref 3.5–5.1)
Sodium: 135 mmol/L (ref 135–145)
Total Bilirubin: 0.5 mg/dL (ref 0.3–1.2)
Total Protein: 6.3 g/dL — ABNORMAL LOW (ref 6.5–8.1)

## 2019-07-28 LAB — CBC
HCT: 32.4 % — ABNORMAL LOW (ref 36.0–46.0)
Hemoglobin: 11.7 g/dL — ABNORMAL LOW (ref 12.0–15.0)
MCH: 31 pg (ref 26.0–34.0)
MCHC: 36.1 g/dL — ABNORMAL HIGH (ref 30.0–36.0)
MCV: 85.9 fL (ref 80.0–100.0)
Platelets: 180 10*3/uL (ref 150–400)
RBC: 3.77 MIL/uL — ABNORMAL LOW (ref 3.87–5.11)
RDW: 12.2 % (ref 11.5–15.5)
WBC: 8.4 10*3/uL (ref 4.0–10.5)
nRBC: 0 % (ref 0.0–0.2)

## 2019-07-28 LAB — KLEIHAUER-BETKE STAIN
Fetal Cells %: 5 %
Quantitation Fetal Hemoglobin: 0.0245 mL

## 2019-07-28 LAB — FIBRINOGEN: Fibrinogen: 417 mg/dL (ref 210–475)

## 2019-07-28 LAB — RUPTURE OF MEMBRANE (ROM)PLUS: Rom Plus: NEGATIVE

## 2019-07-28 LAB — TYPE AND SCREEN
ABO/RH(D): B POS
Antibody Screen: NEGATIVE

## 2019-07-28 LAB — PROTEIN / CREATININE RATIO, URINE
Creatinine, Urine: 37 mg/dL
Protein Creatinine Ratio: 0.19 mg/mg{Cre} — ABNORMAL HIGH (ref 0.00–0.15)
Total Protein, Urine: 7 mg/dL

## 2019-07-28 MED ORDER — LACTATED RINGERS IV BOLUS
500.0000 mL | Freq: Once | INTRAVENOUS | Status: AC
  Administered 2019-07-28: 500 mL via INTRAVENOUS

## 2019-07-28 MED ORDER — DEXTROSE-NACL 5-0.45 % IV SOLN
INTRAVENOUS | Status: DC
  Filled 2019-07-28 (×3): qty 1000

## 2019-07-28 MED ORDER — BETAMETHASONE SOD PHOS & ACET 6 (3-3) MG/ML IJ SUSP
12.0000 mg | INTRAMUSCULAR | Status: AC
  Administered 2019-07-29: 12 mg via INTRAMUSCULAR
  Filled 2019-07-28: qty 5

## 2019-07-28 MED ORDER — FENTANYL CITRATE (PF) 100 MCG/2ML IJ SOLN: 50.0000 ug | INTRAMUSCULAR | Status: DC | PRN

## 2019-07-28 MED ORDER — LACTATED RINGERS IV SOLN: INTRAVENOUS | Status: DC

## 2019-07-28 MED ORDER — BETAMETHASONE SOD PHOS & ACET 6 (3-3) MG/ML IJ SUSP
12.0000 mg | INTRAMUSCULAR | Status: AC
  Administered 2019-07-28: 12 mg via INTRAMUSCULAR
  Filled 2019-07-28: qty 5

## 2019-07-28 NOTE — OB Triage Note (Signed)
Patient was sent to Labor and Delivery from office visit for vaginal bleeding. Patient states that she had bleeding and fluid from the vagina this morning and states that it was enough to saturate a pad. She states that it hasn't been as bad this morning since that time, but does states she has had some contractions off and on. She reports + fetal movement. Vital signs stable. Will continue to monitor.

## 2019-07-28 NOTE — H&P (Signed)
Obstetric H&P   Chief Complaint: Third trimester bleeding  Prenatal Care Provider: [facility]  History of Present Illness: 29 y.o. G2P0101 [redacted]w[redacted]d by 09/15/2019, by Last Menstrual Period presenting to L&D with vaginal bleeding.  The patient was followed for a marginal posterior placenta previa which resolved on follow up imaging.  She has a history of preterm deliver at [redacted]w[redacted]d with G1 but secondary to insurance and financial reasons opted for vaginal progesterone rather than Makena.  She has had intermittent spotting throughout the early third trimester.  However, this has increased in the past 24-hrs.  She was seen in clinic today by Dr. Jean Rosenthal who noted dark port wine colored blood.  No recent abdominal trauma.  Cervix was closed.  Normal fluid and placenta on bedside ultrasound in clinic today. She reports +FM, no LOF, irregular contractions.    Pregravid weight 59.4 kg Total Weight Gain 9.979 kg  pregnancy Problems (from 01/28/19 to present)    Problem Noted Resolved   Vaginal bleeding during pregnancy 06/10/2019 by Conard Novak, MD No   Placenta previa antepartum 04/20/2019 by Conard Novak, MD No   Overview Signed 06/10/2019  9:15 AM by Conard Novak, MD    Resolved at 26 weeks      Constipation during pregnancy 02/14/2019 by Conard Novak, MD No   Constipation 01/28/2019 by Conard Novak, MD No   Supervision of high risk pregnancy, antepartum 01/28/2019 by Conard Novak, MD No   Overview Addendum 06/20/2019 11:31 AM by Conard Novak, MD    [facility] Prenatal Labs  Dating L=7 Blood type: B/Positive/-- (01/15 1255)   Genetic Screen 1 Screen:    AFP:     Quad:     NIPS: Antibody:Negative (01/15 1255)  Anatomic [place] complete Rubella: 9.10 (01/15 1255)  Varicella: Immune  GTT Third trimester: 129 RPR: Non Reactive (01/15 1255)   Rhogam n/a HBsAg: Negative (01/15 1255)   TDaP vaccine 07/13/2019 Flu Shot: HIV: Non Reactive (01/15 1255)   Baby Food                                 GBS:   Contraception  Pap:12/01/2018 NILM  CBB     CS/VBAC N/A   Support Person Azzie Glatter           Previous Version   History of preterm delivery, currently pregnant 01/28/2019 by Conard Novak, MD No   Overview Addendum 06/20/2019 11:31 AM by Conard Novak, MD    [x ] progesterone vaginal suppositories [x]  16 week cervical length u/s      Previous Version       Review of Systems: 10 point review of systems negative unless otherwise noted in HPI  Past Medical History: Patient Active Problem List   Diagnosis Date Noted  . [redacted] weeks gestation of pregnancy 07/28/2019  . Vaginal bleeding in pregnancy, third trimester 07/05/2019  . Vaginal bleeding during pregnancy 06/10/2019  . Placenta previa antepartum 04/20/2019    Resolved at 26 weeks   . Constipation during pregnancy 02/14/2019  . Constipation 01/28/2019  . Supervision of high risk pregnancy, antepartum 01/28/2019    [facility] Prenatal Labs  Dating L=7 Blood type: B/Positive/-- (01/15 1255)   Genetic Screen 1 Screen:    AFP:     Quad:     NIPS: Antibody:Negative (01/15 1255)  Anatomic 02-21-1982 complete Rubella: 9.10 (01/15 1255)  Varicella: Immune  GTT Third trimester: [ ]  RPR: Non Reactive (01/15 1255)   Rhogam n/a HBsAg: Negative (01/15 1255)   TDaP vaccine                       Flu Shot: HIV: Non Reactive (01/15 1255)   Baby Food                                GBS:   Contraception  Pap:  CBB     CS/VBAC    Support Person [place], hubby        . History of preterm delivery, currently pregnant 01/28/2019    [x ] progesterone vaginal suppositories [x]  16 week cervical length u/s   . GAD (generalized anxiety disorder) 07/27/2014    Followed by Dr. , [facility]. Was previously on Fluoxetine and Adderall but has not needed it recently.   . Irritable bowel syndrome with constipation 07/27/2014    Doing okay on amitiza [facility] one po bid for a few years now.  Still has some constipation issues but exercises regularly and has healthy eating habits.    . Screening cholesterol level 07/27/2014  . Other fatigue 07/27/2014    Past Surgical History: Past Surgical History:  Procedure Laterality Date  . EXCISION OF KELOID    . EXTERNAL EAR SURGERY Bilateral 2012   NODULES REMOVED   . TONSILLECTOMY  2015  . WISDOM TOOTH EXTRACTION      Past Obstetric History: # 1 - Date: 04/14/17, Sex: Female, Weight: 2500 g, GA: [redacted]w[redacted]d, Delivery: Vaginal, Spontaneous, Apgar1: 8, Apgar5: 9, Living: Living, Birth Comments: None  # 2 - Date: None, Sex: None, Weight: None, GA: None, Delivery: None, Apgar1: None, Apgar5: None, Living: None, Birth Comments: None   Past Gynecologic History:  Family History: Family History  Problem Relation Age of Onset  . Diabetes Maternal Grandfather   . Cancer Other        UNKNOWN AGE/TYPE-THINKS THROAT    Social History: Social History   Socioeconomic History  . Marital status: Married    Spouse name: Not on file  . Number of children: 0  . Years of education: 42  . Highest education level: Not on file  Occupational History  . Occupation: Stylist    Comment: [employer]  Tobacco Use  . Smoking status: Never Smoker  . Smokeless tobacco: Never Used  Vaping Use  . Vaping Use: Never used  Substance and Sexual Activity  . Alcohol use: Yes    Alcohol/week: 0.0 standard drinks    Comment: seldom  . Drug use: No  . Sexual activity: Yes    Partners: Male    Birth control/protection: None  Other Topics Concern  . Not on file  Social History Narrative  . Not on file   Social Determinants of Health   Financial Resource Strain:   . Difficulty of Paying Living Expenses:   Food Insecurity:   . Worried About [redacted]w[redacted]d in the Last Year:   . 20 in the Last Year:   Transportation Needs:   . Programme researcher, broadcasting/film/video (Medical):   Barista Lack of Transportation (Non-Medical):   Physical Activity:   .  Days of Exercise per Week:   . Minutes of Exercise per Session:   Stress:   . Feeling of Stress :   Social Connections:   . Frequency of Communication  with Friends and Family:   . Frequency of Social Gatherings with Friends and Family:   . Attends Religious Services:   . Active Member of Clubs or Organizations:   . Attends Banker Meetings:   Marland Kitchen Marital Status:   Intimate Partner Violence:   . Fear of Current or Ex-Partner:   . Emotionally Abused:   Marland Kitchen Physically Abused:   . Sexually Abused:     Medications: Prior to Admission medications   Medication Sig Start Date End Date Taking? Authorizing Provider  Prenatal Vit-Fe Fumarate-FA (PRENATAL VITAMIN PO) Take by mouth.   Yes [provider]  progesterone 200 MG SUPP Place 1 suppository (200 mg total) vaginally at bedtime. 03/23/19  Yes Conard Novak, MD    Allergies: Allergies  Allergen Reactions  . Fluvoxamine Anxiety    Physical Exam: Vitals: Blood pressure 123/77, pulse 100, temperature 98.2 F (36.8 C), temperature source Oral, resp. rate 16, height 5\' 1"  (1.549 m), weight 69.4 kg, last menstrual period 12/09/2018.  Urine Dip Protein: N/A  FHT: 150, moderate, +accels, no decels Toco: q21min  General: NAD HEENT: normocephalic, anicteric Pulmonary: No increased work of breathing Cardiovascular: RRR, distal pulses 2+ Abdomen: Gravid, non-tender Genitourinary:  External: Normal external female genitalia.  Normal urethral meatus, normal Bartholin's and Skene's glands.    Vagina: Normal vaginal mucosa, no evidence of prolapse.    Cervix: Grossly normal in appearance and closed, no active bleeding noted from cervical os, there is some bleeding noted on posterior cervix  Rectal: deferred  Lymphatic: no evidence of inguinal lymphadenopathy Extremities: no edema, erythema, or tenderness Neurologic: Grossly intact Psychiatric: mood appropriate, affect full  Labs: Results for orders placed or  performed during the hospital encounter of 07/28/19 (from the past 24 hour(s))  Protein / creatinine ratio, urine     Status: Abnormal   Collection Time: 07/28/19 11:19 AM  Result Value Ref Range   Creatinine, Urine 37 mg/dL   Total Protein, Urine 7 mg/dL   Protein Creatinine Ratio 0.19 (H) 0.00 - 0.15 mg/mg[Cre]  CBC     Status: Abnormal   Collection Time: 07/28/19 11:22 AM  Result Value Ref Range   WBC 8.4 4.0 - 10.5 K/uL   RBC 3.77 (L) 3.87 - 5.11 MIL/uL   Hemoglobin 11.7 (L) 12.0 - 15.0 g/dL   HCT 07/30/19 (L) 36 - 46 %   MCV 85.9 80.0 - 100.0 fL   MCH 31.0 26.0 - 34.0 pg   MCHC 36.1 (H) 30.0 - 36.0 g/dL   RDW 25.6 38.9 - 37.3 %   Platelets 180 150 - 400 K/uL   nRBC 0.0 0.0 - 0.2 %  Comprehensive metabolic panel     Status: Abnormal   Collection Time: 07/28/19 11:22 AM  Result Value Ref Range   Sodium 135 135 - 145 mmol/L   Potassium 3.8 3.5 - 5.1 mmol/L   Chloride 103 98 - 111 mmol/L   CO2 26 22 - 32 mmol/L   Glucose, Bld 83 70 - 99 mg/dL   BUN 5 (L) 6 - 20 mg/dL   Creatinine, Ser 07/30/19 (L) 0.44 - 1.00 mg/dL   Calcium 8.7 (L) 8.9 - 10.3 mg/dL   Total Protein 6.3 (L) 6.5 - 8.1 g/dL   Albumin 3.3 (L) 3.5 - 5.0 g/dL   AST 18 15 - 41 U/L   ALT 13 0 - 44 U/L   Alkaline Phosphatase 92 38 - 126 U/L   Total Bilirubin 0.5 0.3 - 1.2 mg/dL  GFR calc non Af Amer >60 >60 mL/min   GFR calc Af Amer >60 >60 mL/min   Anion gap 6 5 - 15  Type and screen [facility]     Status: None (Preliminary result)   Collection Time: 07/28/19 11:22 AM  Result Value Ref Range   ABO/RH(D) PENDING    Antibody Screen PENDING    Sample Expiration      07/31/2019,2359 Performed at [facility], [address] [place], [place] [address]   Type and screen [facility]     Status: None (Preliminary result)   Collection Time: 07/28/19 11:34 AM  Result Value Ref Range   ABO/RH(D) PENDING    Antibody Screen PENDING    Sample Expiration       07/31/2019,2359 Performed at [facility], [address] [place], [place] [address]     Assessment: 29 y.o. G2P0101 836w0d by 09/15/2019, by Last Menstrual Period presenting with third trimester vaginal bleeding and contractions  Plan: 1) Third trimester vaginal bleeding - DDx includes placental abruption, preterm labor, and or ROM - negative ferning, subjectively normal fluid on beside ultrasound, sending ROM plus - KB, fibrinogen, CBC ordered - Initiate BMZ - remain NPO, start IV fluids  2) Fetus - cat I tracing  3) PNL - Blood type --/--/PENDING (07/15 1134) / Anti-bodyscreen PENDING (07/15 1134) / Rubella 9.10 (01/15 1255) / Varicella Immune / RPR Non Reactive (06/07 1148) / HBsAg Negative (01/15 1255) / HIV Non Reactive (06/07 1148) / 1-hr OGTT 129 / GBS unknown  4) Immunization History -  Immunization History  Administered Date(s) Administered  . Hpv 08/14/2006, 10/14/2006, 02/14/2007  . Influenza,inj,Quad PF,6+ Mos 12/01/2018  . Tdap 03/02/2017, 07/13/2019    5) Disposition - pending delivery  Vena AustriaAndreas Yuliet Needs, MD, Merlinda FrederickFACOG [facility], [facility] 07/28/2019, 12:02 PM

## 2019-07-28 NOTE — Progress Notes (Signed)
Subjective:  Still having contractions, able to breath through them.  No further bleeding.    Objective:   Vitals: Blood pressure 123/77, pulse 100, temperature 98.2 F (36.8 C), temperature source Oral, resp. rate 16, height 5\' 1"  (1.549 m), weight 69.4 kg, last menstrual period 12/09/2018. General: NAD Abdomen:gravid, mildly tender Cervical Exam:  Dilation: 1 Effacement (%): 50 Station: -3 Exam by:: Japhet Morgenthaler MD Scant brown discharge on glove, no active bleeding noted  FHT: 140, moderate, +accels, no decels Toco:q3-75min  Results for orders placed or performed during the hospital encounter of 07/28/19 (from the past 24 hour(s))  Protein / creatinine ratio, urine     Status: Abnormal   Collection Time: 07/28/19 11:19 AM  Result Value Ref Range   Creatinine, Urine 37 mg/dL   Total Protein, Urine 7 mg/dL   Protein Creatinine Ratio 0.19 (H) 0.00 - 0.15 mg/mg[Cre]  Fibrinogen     Status: None   Collection Time: 07/28/19 11:19 AM  Result Value Ref Range   Fibrinogen 417 210 - 475 mg/dL  ROM Plus ([facility] only)     Status: None   Collection Time: 07/28/19 11:19 AM  Result Value Ref Range   Rom Plus NEGATIVE   CBC     Status: Abnormal   Collection Time: 07/28/19 11:22 AM  Result Value Ref Range   WBC 8.4 4.0 - 10.5 K/uL   RBC 3.77 (L) 3.87 - 5.11 MIL/uL   Hemoglobin 11.7 (L) 12.0 - 15.0 g/dL   HCT 07/30/19 (L) 36 - 46 %   MCV 85.9 80.0 - 100.0 fL   MCH 31.0 26.0 - 34.0 pg   MCHC 36.1 (H) 30.0 - 36.0 g/dL   RDW 32.4 40.1 - 02.7 %   Platelets 180 150 - 400 K/uL   nRBC 0.0 0.0 - 0.2 %  Comprehensive metabolic panel     Status: Abnormal   Collection Time: 07/28/19 11:22 AM  Result Value Ref Range   Sodium 135 135 - 145 mmol/L   Potassium 3.8 3.5 - 5.1 mmol/L   Chloride 103 98 - 111 mmol/L   CO2 26 22 - 32 mmol/L   Glucose, Bld 83 70 - 99 mg/dL   BUN 5 (L) 6 - 20 mg/dL   Creatinine, Ser 07/30/19 (L) 0.44 - 1.00 mg/dL   Calcium 8.7 (L) 8.9 - 10.3 mg/dL   Total Protein 6.3 (L) 6.5  - 8.1 g/dL   Albumin 3.3 (L) 3.5 - 5.0 g/dL   AST 18 15 - 41 U/L   ALT 13 0 - 44 U/L   Alkaline Phosphatase 92 38 - 126 U/L   Total Bilirubin 0.5 0.3 - 1.2 mg/dL   GFR calc non Af Amer >60 >60 mL/min   GFR calc Af Amer >60 >60 mL/min   Anion gap 6 5 - 15  Type and screen [facility]     Status: None   Collection Time: 07/28/19 11:22 AM  Result Value Ref Range   ABO/RH(D) B POS    Antibody Screen NEG    Sample Expiration      07/31/2019,2359 Performed at [facility], [address] [place], [place]   Kleihauer-Betke stain     Status: None   Collection Time: 07/28/19 11:22 AM  Result Value Ref Range   Fetal Cells % 5 %   Quantitation Fetal Hemoglobin 0.0245 mL   # Vials RhIg NOT INDICATED     Assessment:   29 y.o. G2P0101 [redacted]w[redacted]d with  placental abruption   Plan:   1) Placental abruption - based on positive KB, suspect small chronic abruption given off an on spotting in the preceding weeks - continue NPO - D 5 1/2 NS maintenance fluid - Received BMZ x 1 this morning, BMZ x 2 tomorrow - heating pad prn, avoid narcotics for now in order to avoid interference with fetal heart rate interpretation  2) Fetus - stable   Vena Austria, MD, [facility], [facility] 07/28/2019, 3:18 PM

## 2019-07-28 NOTE — Progress Notes (Signed)
Routine Prenatal Care Visit  Subjective  Dorothy Townsend is a 29 y.o. G2P0101 at [redacted]w[redacted]d being seen today for ongoing prenatal care.  She is currently monitored for the following issues for this high-risk pregnancy and has GAD (generalized anxiety disorder); Irritable bowel syndrome with constipation; Screening cholesterol level; Other fatigue; Constipation; Supervision of high risk pregnancy, antepartum; History of preterm delivery, currently pregnant; Constipation during pregnancy; Placenta previa antepartum; Vaginal bleeding during pregnancy; and Vaginal bleeding in pregnancy, third trimester on their problem list.  ----------------------------------------------------------------------------------- Patient reports vaginal bleeding off and on over the past few days, big gush this morning of dark red/maroon blood, spotting now.  She states that it felt like water coming out, but has not continued to leak.  She has had contractions over the past week, but at times very painful, especially in her back.  She has had nausea and vomiting with some of her contractions. She denies HA, visual changes, and RUQ pain, though she states that she feels a little off today. .   Contractions: Irritability. Vag. Bleeding: Scant.  Movement: Present. Leaking Fluid denies.  ----------------------------------------------------------------------------------- The following portions of the patient's history were reviewed and updated as appropriate: allergies, current medications, past family history, past medical history, past social history, past surgical history and problem list. Problem list updated.  Objective  Blood pressure (!) 143/81, pulse (!) 112, weight 154 lb (69.9 kg), last menstrual period 12/09/2018. Pregravid weight 131 lb (59.4 kg) Total Weight Gain 23 lb (10.4 kg) Urinalysis: Urine Protein    Urine Glucose    Fetal Status: Fetal Heart Rate (bpm): 148   Movement: Present  Presentation: Vertex  General:   Alert, oriented and cooperative. Patient is in no acute distress.  Skin: Skin is warm and dry. No rash noted.   Cardiovascular: Normal heart rate noted  Respiratory: Normal respiratory effort, no problems with respiration noted  Abdomen: Soft, gravid, appropriate for gestational age. Pain/Pressure: Present     Pelvic:  Cervical exam performed Dilation: Fingertip Effacement (%): 50 Station: -3  Extremities: Normal range of motion.  Edema: None  Mental Status: Normal mood and affect. Normal behavior. Normal judgment and thought content.   BSUS: no overt abruption noted. Fetus cephalic, placenta posterior, fluid subjectively normal. Movement noted.   Assessment   29 y.o. G2P0101 at [redacted]w[redacted]d by  09/15/2019, by Last Menstrual Period presenting for work-in prenatal visit  Plan   pregnancy Problems (from 01/28/19 to present)    Problem Noted Resolved   Vaginal bleeding during pregnancy 06/10/2019 by Conard Novak, MD No   Placenta previa antepartum 04/20/2019 by Conard Novak, MD No   Overview Signed 06/10/2019  9:15 AM by Conard Novak, MD    Resolved at 26 weeks      Constipation during pregnancy 02/14/2019 by Conard Novak, MD No   Constipation 01/28/2019 by Conard Novak, MD No   Supervision of high risk pregnancy, antepartum 01/28/2019 by Conard Novak, MD No   Overview Addendum 06/20/2019 11:31 AM by Conard Novak, MD    [facility]inic [facility] Prenatal Labs  Dating L=7 Blood type: B/Positive/-- (01/15 1255)   Genetic Screen 1 Screen:    AFP:     Quad:     NIPS: Antibody:Negative (01/15 1255)  Anatomic [place] complete Rubella: 9.10 (01/15 1255)  Varicella: Immune  GTT Third trimester: [ ]  RPR: Non Reactive (01/15 1255)   Rhogam n/a HBsAg: Negative (01/15 1255)   TDaP vaccine  Flu Shot: HIV: Non Reactive (01/15 1255)   Baby Food                                GBS:   Contraception  Pap:  CBB     CS/VBAC    Support Person Azzie Glatter            Previous Version   History of preterm delivery, currently pregnant 01/28/2019 by Conard Novak, MD No   Overview Addendum 06/20/2019 11:31 AM by Conard Novak, MD    [x ] progesterone vaginal suppositories [x]  16 week cervical length u/s      Previous Version       Preterm labor symptoms and general obstetric precautions including but not limited to vaginal bleeding, contractions, leaking of fluid and fetal movement were reviewed in detail with the patient. Please refer to After Visit Summary for other counseling recommendations.   - To L&D for betamethasone, KB, any other necessary labs, NST, labor assessment. - on call MD aware as well as L&D RNs.   Return in about 4 days (around 08/01/2019) for 08/03/2019 for growth and Routine prenatal/NST Dr [place] (may double/over book).  Jean Rosenthal, MD, [name] [facility], [facility] 07/28/2019 9:55 AM

## 2019-07-28 NOTE — Progress Notes (Signed)
Subjective:  Still having some lower back pain mostly and pressure. No further bleeding  Objective:   Vitals: Blood pressure 112/62, pulse (!) 104, temperature 98.2 F (36.8 C), temperature source Oral, resp. rate 16, height 5\' 1"  (1.549 m), weight 69.4 kg, last menstrual period 12/09/2018. General: NAD Abdomen: gravid, non-tender Cervical Exam:  Dilation: 1 Effacement (%): 50 Station: -3 Presentation: Vertex Exam by:: Cuinn Westerhold MD  FHT: 130, moderate, +accels, no decels Toco: q5-60min  Results for orders placed or performed during the hospital encounter of 07/28/19 (from the past 24 hour(s))  Protein / creatinine ratio, urine     Status: Abnormal   Collection Time: 07/28/19 11:19 AM  Result Value Ref Range   Creatinine, Urine 37 mg/dL   Total Protein, Urine 7 mg/dL   Protein Creatinine Ratio 0.19 (H) 0.00 - 0.15 mg/mg[Cre]  Fibrinogen     Status: None   Collection Time: 07/28/19 11:19 AM  Result Value Ref Range   Fibrinogen 417 210 - 475 mg/dL  ROM Plus ([facility] only)     Status: None   Collection Time: 07/28/19 11:19 AM  Result Value Ref Range   Rom Plus NEGATIVE   CBC     Status: Abnormal   Collection Time: 07/28/19 11:22 AM  Result Value Ref Range   WBC 8.4 4.0 - 10.5 K/uL   RBC 3.77 (L) 3.87 - 5.11 MIL/uL   Hemoglobin 11.7 (L) 12.0 - 15.0 g/dL   HCT 07/30/19 (L) 36 - 46 %   MCV 85.9 80.0 - 100.0 fL   MCH 31.0 26.0 - 34.0 pg   MCHC 36.1 (H) 30.0 - 36.0 g/dL   RDW 46.9 62.9 - 52.8 %   Platelets 180 150 - 400 K/uL   nRBC 0.0 0.0 - 0.2 %  Comprehensive metabolic panel     Status: Abnormal   Collection Time: 07/28/19 11:22 AM  Result Value Ref Range   Sodium 135 135 - 145 mmol/L   Potassium 3.8 3.5 - 5.1 mmol/L   Chloride 103 98 - 111 mmol/L   CO2 26 22 - 32 mmol/L   Glucose, Bld 83 70 - 99 mg/dL   BUN 5 (L) 6 - 20 mg/dL   Creatinine, Ser 07/30/19 (L) 0.44 - 1.00 mg/dL   Calcium 8.7 (L) 8.9 - 10.3 mg/dL   Total Protein 6.3 (L) 6.5 - 8.1 g/dL   Albumin 3.3 (L) 3.5 -  5.0 g/dL   AST 18 15 - 41 U/L   ALT 13 0 - 44 U/L   Alkaline Phosphatase 92 38 - 126 U/L   Total Bilirubin 0.5 0.3 - 1.2 mg/dL   GFR calc non Af Amer >60 >60 mL/min   GFR calc Af Amer >60 >60 mL/min   Anion gap 6 5 - 15  Type and screen [facility]     Status: None   Collection Time: 07/28/19 11:22 AM  Result Value Ref Range   ABO/RH(D) B POS    Antibody Screen NEG    Sample Expiration      07/31/2019,2359 Performed at [facility], [address] [place], [place]   Kleihauer-Betke stain     Status: None   Collection Time: 07/28/19 11:22 AM  Result Value Ref Range   Fetal Cells % 5 %   Quantitation Fetal Hemoglobin 0.0245 mL   # Vials RhIg NOT INDICATED     Assessment:   29 y.o. G2P0101 [redacted]w[redacted]d with placental abruption  Plan:   1)  Abruption - stable no further bleeding no cervical change - may advance diet to full liquids - Fentanyl prn overnight in addition to heating pad - second dose of BMZ tomorrow AM  2) Fetus - cat I tracing  Vena Austria, MD, [facility], [facility] 07/28/2019, 9:30 PM

## 2019-07-29 ENCOUNTER — Encounter: Payer: 59 | Admitting: Obstetrics and Gynecology

## 2019-07-29 ENCOUNTER — Inpatient Hospital Stay (HOSPITAL_BASED_OUTPATIENT_CLINIC_OR_DEPARTMENT_OTHER): Payer: 59

## 2019-07-29 DIAGNOSIS — O4693 Antepartum hemorrhage, unspecified, third trimester: Secondary | ICD-10-CM

## 2019-07-29 DIAGNOSIS — O368230 Fetal anemia and thrombocytopenia, third trimester, not applicable or unspecified: Secondary | ICD-10-CM

## 2019-07-29 DIAGNOSIS — Z20822 Contact with and (suspected) exposure to covid-19: Secondary | ICD-10-CM | POA: Diagnosis present

## 2019-07-29 DIAGNOSIS — O4593 Premature separation of placenta, unspecified, third trimester: Secondary | ICD-10-CM | POA: Diagnosis present

## 2019-07-29 DIAGNOSIS — O0990 Supervision of high risk pregnancy, unspecified, unspecified trimester: Secondary | ICD-10-CM | POA: Diagnosis not present

## 2019-07-29 DIAGNOSIS — Z3A33 33 weeks gestation of pregnancy: Secondary | ICD-10-CM

## 2019-07-29 DIAGNOSIS — O99820 Streptococcus B carrier state complicating pregnancy: Secondary | ICD-10-CM | POA: Diagnosis present

## 2019-07-29 DIAGNOSIS — K5900 Constipation, unspecified: Secondary | ICD-10-CM | POA: Diagnosis not present

## 2019-07-29 DIAGNOSIS — O99612 Diseases of the digestive system complicating pregnancy, second trimester: Secondary | ICD-10-CM | POA: Diagnosis not present

## 2019-07-29 LAB — CBC
HCT: 31.7 % — ABNORMAL LOW (ref 36.0–46.0)
Hemoglobin: 11.1 g/dL — ABNORMAL LOW (ref 12.0–15.0)
MCH: 30.3 pg (ref 26.0–34.0)
MCHC: 35 g/dL (ref 30.0–36.0)
MCV: 86.6 fL (ref 80.0–100.0)
Platelets: 193 10*3/uL (ref 150–400)
RBC: 3.66 MIL/uL — ABNORMAL LOW (ref 3.87–5.11)
RDW: 12.2 % (ref 11.5–15.5)
WBC: 11.8 10*3/uL — ABNORMAL HIGH (ref 4.0–10.5)
nRBC: 0 % (ref 0.0–0.2)

## 2019-07-29 LAB — FIBRINOGEN: Fibrinogen: 380 mg/dL (ref 210–475)

## 2019-07-29 LAB — SARS CORONAVIRUS 2 BY RT PCR (HOSPITAL ORDER, PERFORMED IN ~~LOC~~ HOSPITAL LAB): SARS Coronavirus 2: NEGATIVE

## 2019-07-29 LAB — KLEIHAUER-BETKE STAIN
Fetal Cells %: 0.5 %
Quantitation Fetal Hemoglobin: 0.0025 mL

## 2019-07-29 MED ORDER — LACTATED RINGERS IV SOLN: INTRAVENOUS | Status: DC

## 2019-07-29 MED ORDER — PRENATAL MULTIVITAMIN CH
1.0000 | ORAL_TABLET | Freq: Every day | ORAL | Status: DC
  Administered 2019-07-30 – 2019-07-31 (×2): 1 via ORAL
  Filled 2019-07-29 (×2): qty 1

## 2019-07-29 MED ORDER — LACTATED RINGERS IV BOLUS: 500.0000 mL | Freq: Once | INTRAVENOUS | Status: DC

## 2019-07-29 MED ORDER — LACTATED RINGERS IV BOLUS
500.0000 mL | Freq: Once | INTRAVENOUS | Status: AC
  Administered 2019-07-29: 500 mL via INTRAVENOUS

## 2019-07-29 MED ORDER — FAMOTIDINE 20 MG PO TABS
20.0000 mg | ORAL_TABLET | Freq: Two times a day (BID) | ORAL | Status: DC | PRN
  Administered 2019-07-29 – 2019-07-30 (×2): 20 mg via ORAL
  Filled 2019-07-29 (×2): qty 1

## 2019-07-29 NOTE — Progress Notes (Addendum)
Called by Jobie Quaker due to concern for fetal heart rate tracing at 11:03PM.   Reviewed tracing and discussed at 11:35 PM. I personally reviewed the fetal heart rate tracing.   The time in question is just before 11pm and after.   Starting at around 10:53PM there is a short period where there is a small drop in heart rate of undetermined significance from baseline of 125 bpm to 115bpm. This lasts for a bout ~40 seconds. At 10:54 there is a pattern heart rate which shows a pattern with amplitude of about 10-18 bpm.  The pattern is somewhat undulating for  8-10 minutes with a more pronounced break in the pattern.  There then appears to be an increase in heart rate and then the patient is disconnected from the monitor for nearly 4 minutes.  Once the tracing resumes the pattern appears to be different with moderate variability (different in that there is no undulating sort of appearance to the fetal heart rate tracing).  There is another short period after this this is difficult to interpret that is short lived.  At about 11:28 there is a prolonged fetal heart rate acceleration.  There is a short period (minutes) of minimal variability, then resumption of moderate variability and no appearance of undulation to consider.   She is removed from the fetal monitor at 11:53:30 PM and replaced on the monitor at 12:02AM. At the time of this note, there is moderate variability, no decelerations.   The overall tracing is reassuring and category 1 with an acceleration at 12:08AM.   Based on the criteria in ACOG PB 106 (07/2007), which is the latest published at this time, the definition of sinusoidal fetal heart rate pattern is "Visually apparent, smooth, sine wave-like undulating pattern in FHR baseline with a cycle frequency of 3-5 per minutes which persists for 20 minutes or more." Given this definition, I do not believe this tracing meets criteria for sinusoidal pattern.  At a minimum, the time of tracing in question  did not last for 20 minutes, though it was broken when the patient was removed from the monitor.  Even at this point, there appeared to be a more normal tracing.   There has been no further vaginal bleeding.  The patient does report palpitations. Her heart rate did briefly rise to the 160s at 11:24-11:25PM.  It has since returned to the low 100s.   Will continue to monitor the fetus closely. Will check a CBC.  She has SCDs already ordered and she is ambulating. Will affect maternal position changes and give an LR fluid bolus of 500 mL.    [name], MD, [facility], [facility] 07/30/2019 12:16 AM    ADDENDUM: CBC shows stable maternal hemoglobin, hematocrit, WBCs, and platelets.

## 2019-07-29 NOTE — Consult Note (Signed)
[facility] 07/29/2019    3:45 PM  Neonatal Medicine Consultation         Dorothy Townsend          MRN:  [id number]  I was called at the request of the patient's obstetrician (Dr. Bonney Aid) to speak to this patient due to potential premature delivery at 33+ weeks related to placental abruption.  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  The patient previously gave birth to a 36-week female who was briefly admitted to our SCN.Marland Kitchen  She is 33 1/7 weeks currently.  She is admitted to L&D (Obs), and is receiving treatment that includes betamethasone (received a dose on 7/15 and 7/16).  The baby is a female.  I reviewed expectations for a baby born at 33+ weeks, including survival, length of stay, issues such as respiratory distress, infection, feeding.  I described how we provide respiratory and feeding support.  Mom plans to breast feed.  I let the patient and her husband know that the baby's outlook generally improves the longer she remains undelivered.  I spent 30 minutes reviewing the record, speaking to the patient, and entering appropriate documentation.     _____________________ Angelita Ingles, MD Attending Neonatologist

## 2019-07-29 NOTE — Progress Notes (Signed)
Patient removed from fetal monitoring per provider to walk. Will continue to monitor.

## 2019-07-29 NOTE — Progress Notes (Signed)
Day of Admission: 07/28/2019 Today's Date: 07/29/2019 Diagnosis: Third trimester bleeding/ placental abruption, based on positive KB, suspect small chronic abruption given off an on spotting in the preceding weeks   S: Was having back pain that worsened periodically with contractions. Contractions and back pain lessened when up ambulating this AM. Hungry-on liquids, awaiting repeat KB. Baby moving well. Had a small amount of tan-brown discharge when she first got out of bed this Am. Has not seen any further blood. Received first dose of BMZ yesterday.   O: BP (!) 115/58 (BP Location: Right Arm)   Pulse 86   Temp 97.8 F (36.6 C) (Oral)   Resp 16   Ht 5\' 1"  (1.549 m)   Wt 69.4 kg   LMP 12/09/2018   BMI 28.91 kg/m    Abdomen: soft, NT, cephalic presentation  FHR:  Toco:  Cervix: 1.5/70%/-1. No blood noted on examining glove  Results for orders placed or performed during the hospital encounter of 07/28/19 (from the past 24 hour(s))  Protein / creatinine ratio, urine     Status: Abnormal   Collection Time: 07/28/19 11:19 AM  Result Value Ref Range   Creatinine, Urine 37 mg/dL   Total Protein, Urine 7 mg/dL   Protein Creatinine Ratio 0.19 (H) 0.00 - 0.15 mg/mg[Cre]  Fibrinogen     Status: None   Collection Time: 07/28/19 11:19 AM  Result Value Ref Range   Fibrinogen 417 210 - 475 mg/dL  ROM Plus ([facility] only)     Status: None   Collection Time: 07/28/19 11:19 AM  Result Value Ref Range   Rom Plus NEGATIVE   CBC     Status: Abnormal   Collection Time: 07/28/19 11:22 AM  Result Value Ref Range   WBC 8.4 4.0 - 10.5 K/uL   RBC 3.77 (L) 3.87 - 5.11 MIL/uL   Hemoglobin 11.7 (L) 12.0 - 15.0 g/dL   HCT 07/30/19 (L) 36 - 46 %   MCV 85.9 80.0 - 100.0 fL   MCH 31.0 26.0 - 34.0 pg   MCHC 36.1 (H) 30.0 - 36.0 g/dL   RDW 26.8 34.1 - 96.2 %   Platelets 180 150 - 400 K/uL   nRBC 0.0 0.0 - 0.2 %  Comprehensive metabolic panel     Status: Abnormal   Collection Time: 07/28/19 11:22 AM   Result Value Ref Range   Sodium 135 135 - 145 mmol/L   Potassium 3.8 3.5 - 5.1 mmol/L   Chloride 103 98 - 111 mmol/L   CO2 26 22 - 32 mmol/L   Glucose, Bld 83 70 - 99 mg/dL   BUN 5 (L) 6 - 20 mg/dL   Creatinine, Ser 07/30/19 (L) 0.44 - 1.00 mg/dL   Calcium 8.7 (L) 8.9 - 10.3 mg/dL   Total Protein 6.3 (L) 6.5 - 8.1 g/dL   Albumin 3.3 (L) 3.5 - 5.0 g/dL   AST 18 15 - 41 U/L   ALT 13 0 - 44 U/L   Alkaline Phosphatase 92 38 - 126 U/L   Total Bilirubin 0.5 0.3 - 1.2 mg/dL   GFR calc non Af Amer >60 >60 mL/min   GFR calc Af Amer >60 >60 mL/min   Anion gap 6 5 - 15  Type and screen [facility]     Status: None   Collection Time: 07/28/19 11:22 AM  Result Value Ref Range   ABO/RH(D) B POS    Antibody Screen NEG    Sample Expiration  07/31/2019,2359 Performed at [facility], [address] [place], [place] [address]   Kleihauer-Betke stain     Status: None   Collection Time: 07/28/19 11:22 AM  Result Value Ref Range   Fetal Cells % 5 %   Quantitation Fetal Hemoglobin 0.0245 mL   # Vials RhIg NOT INDICATED   CBC     Status: Abnormal   Collection Time: 07/29/19  7:34 AM  Result Value Ref Range   WBC 11.8 (H) 4.0 - 10.5 K/uL   RBC 3.66 (L) 3.87 - 5.11 MIL/uL   Hemoglobin 11.1 (L) 12.0 - 15.0 g/dL   HCT 94.4 (L) 36 - 46 %   MCV 86.6 80.0 - 100.0 fL   MCH 30.3 26.0 - 34.0 pg   MCHC 35.0 30.0 - 36.0 g/dL   RDW 96.7 59.1 - 63.8 %   Platelets 193 150 - 400 K/uL   nRBC 0.0 0.0 - 0.2 %  Fibrinogen     Status: None   Collection Time: 07/29/19  7:34 AM  Result Value Ref Range   Fibrinogen 380 210 - 475 mg/dL   A: IUP at [id number] abruption. No active bleeding noted at this time. Fetal heart tracing reactive. Minimal change in cervical exam Mild decrease in H&H possibly from hemodilution with IV fluids Awaiting repeat KB  P: Second dose of BMZ due at 1200 today Continue fetal monitoring, but can be up and ambulate intermittently Advance diet to  regular today if status remains stable Consult to neonatology for anticipatory guidance if baby born preterm If patient labors actively, would not stop labor.  GBS pending  Farrel Conners, CNM

## 2019-07-29 NOTE — Progress Notes (Signed)
Pt pulse rate continues to be elevated. Pulse ox applied to patient at this time, temperature taken was WDL. IV fluids restarted and patient repositioned. Provider aware. Will continue to monitor.

## 2019-07-29 NOTE — Progress Notes (Signed)
Daily Antepartum Note  Admission Date: 07/28/2019 Current Date: 07/29/2019 12:19 PM  Dorothy Townsend is a 29 y.o. G2P0101 @ [redacted]w[redacted]d by LMP c/w 7 week ultrsaound, HD#2, admitted for vaginal bleeding in third trimester, likely chronic abruption.  Pregnancy complicated by:  Patient Active Problem List   Diagnosis Date Noted  . [redacted] weeks gestation of pregnancy 07/28/2019  . Vaginal bleeding in pregnancy, third trimester 07/05/2019  . Constipation during pregnancy 02/14/2019  . Supervision of high risk pregnancy, antepartum 01/28/2019  . History of preterm delivery, currently pregnant 01/28/2019  . GAD (generalized anxiety disorder) 07/27/2014    Overnight/24hr events:  No acute events.  Subjective:  Still with only scant bleeding. Notes +FM, no LOF, she is having strong contractions that hurt her back and right lower side. She has been using a heating pad.  She has not taken anything for pain.   Objective:   Vitals:   07/29/19 0346 07/29/19 0715  BP:  (!) 115/58  Pulse:  86  Resp: 16 16  Temp: 98 F (36.7 C) 97.8 F (36.6 C)   Temp:  [97.8 F (36.6 C)-98.4 F (36.9 C)] 97.8 F (36.6 C) (07/16 0715) Pulse Rate:  [86-104] 86 (07/16 0715) Resp:  [16] 16 (07/16 0715) BP: (107-117)/(58-67) 115/58 (07/16 0715) Temp (24hrs), Avg:98.1 F (36.7 C), Min:97.8 F (36.6 C), Max:98.4 F (36.9 C)  No intake or output data in the 24 hours ending 07/29/19 1219   Current Vital Signs 24h Vital Sign Ranges  T 97.8 F (36.6 C) Temp  Avg: 98.1 F (36.7 C)  Min: 97.8 F (36.6 C)  Max: 98.4 F (36.9 C)  BP (!) 115/58 BP  Min: 107/67  Max: 117/66  HR 86 Pulse  Avg: 93.7  Min: 86  Max: 104  RR 16 Resp  Avg: 16  Min: 16  Max: 16  SaO2     No data recorded       24 Hour I/O Current Shift I/O  Time Ins Outs No intake/output data recorded. No intake/output data recorded.   Patient Vitals for the past 24 hrs:  BP Temp Temp src Pulse Resp  07/29/19 0715 (!) 115/58 97.8 F (36.6 C) Oral  86 16  07/29/19 0346 -- 98 F (36.7 C) Oral -- 16  07/29/19 0344 117/66 -- -- 91 --  07/28/19 1928 112/62 98.2 F (36.8 C) Oral -- 16  07/28/19 1536 107/67 98.4 F (36.9 C) Oral (!) 104 16    Physical exam: General: Well nourished, well developed female in no acute distress. Abdomen: gravid, mild ttp RLQ, no rebound Cardiovascular: S1, S2 normal, no murmur, rub or gallop, regular rate and rhythm Respiratory: CTAB Extremities: no clubbing, cyanosis or edema Skin: Warm and dry.   Medications: Current Facility-Administered Medications  Medication Dose Route Frequency Provider Last Rate Last Admin  . dextrose 5 %-0.45 % sodium chloride infusion   Intravenous Continuous Vena Austria, MD 125 mL/hr at 07/29/19 0104 New Bag at 07/29/19 0104  . famotidine (PEPCID) tablet 20 mg  20 mg Oral BID PRN Farrel Conners, CNM      . fentaNYL (SUBLIMAZE) injection 50-100 mcg  50-100 mcg Intravenous Q1H PRN Vena Austria, MD        Labs:  Recent Labs  Lab 07/28/19 1122 07/29/19 0734  WBC 8.4 11.8*  HGB 11.7* 11.1*  HCT 32.4* 31.7*  PLT 180 193    Recent Labs  Lab 07/28/19 1122  NA 135  K 3.8  CL 103  CO2 26  BUN 5*  CREATININE 0.37*  CALCIUM 8.7*  PROT 6.3*  BILITOT 0.5  ALKPHOS 92  ALT 13  AST 18  GLUCOSE 83    Fetal monitoring: Baseline FHR: 135 beats/min Variability: moderate Accelerations: present Decelerations: absent Tocometry: ctx 2-3 q 10 min   Assessment & Plan:  29 y.o. 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 Stable overnight.  * vaginal bleeding: as above. Positive KB stain.  Overnight MD discussed this AM with MFM regarding KB stain and whether or not there is a need for immediate delivery versus monitoring.  Repeat KB this AM. However, if bleeding stops and patient stable, then may be monitored for 48-72 hours.  Low threshold for delivery and will not tocolyze if labors.  She  will receive her second dose of BMTZ this AM.   *Pregnancy: PNV * FWB: reassuring fetal status at this time. If KB increases significantly, will touch base with MFM to see if recommendations change.  If she has a large bleed, then that may be an indication for delivery, as well.  *Preterm: s/p BMTZ x 2 this AM . *PPx: SCDs *FEN/GI: KVO, will advance to full liquid diet this AM. *Dispo: pending clinical stability or delivey  I had a long discussion with the patient and her husband regarding all clinical findings and the thought process behind the current management. We discussed how her picture isn't clear in terms of what might transpire. However, we will continue to be vigilant with low threshold for delivery, if indicated.  All questions answered. I will continue to work with MFM when appropriate.  The fetus is doing well and shows no signs of distress at this time.   [name], MD, [facility], [facility] 07/29/2019 12:26 PM

## 2019-07-30 DIAGNOSIS — O4593 Premature separation of placenta, unspecified, third trimester: Principal | ICD-10-CM

## 2019-07-30 DIAGNOSIS — O99612 Diseases of the digestive system complicating pregnancy, second trimester: Secondary | ICD-10-CM

## 2019-07-30 DIAGNOSIS — Z3A33 33 weeks gestation of pregnancy: Secondary | ICD-10-CM

## 2019-07-30 DIAGNOSIS — O0990 Supervision of high risk pregnancy, unspecified, unspecified trimester: Secondary | ICD-10-CM

## 2019-07-30 DIAGNOSIS — K5900 Constipation, unspecified: Secondary | ICD-10-CM

## 2019-07-30 LAB — CBC
HCT: 32 % — ABNORMAL LOW (ref 36.0–46.0)
Hemoglobin: 11.1 g/dL — ABNORMAL LOW (ref 12.0–15.0)
MCH: 30.6 pg (ref 26.0–34.0)
MCHC: 34.7 g/dL (ref 30.0–36.0)
MCV: 88.2 fL (ref 80.0–100.0)
Platelets: 201 10*3/uL (ref 150–400)
RBC: 3.63 MIL/uL — ABNORMAL LOW (ref 3.87–5.11)
RDW: 12.4 % (ref 11.5–15.5)
WBC: 11.5 10*3/uL — ABNORMAL HIGH (ref 4.0–10.5)
nRBC: 0 % (ref 0.0–0.2)

## 2019-07-30 LAB — STREP GP B NAA: Strep Gp B NAA: POSITIVE — AB

## 2019-07-30 NOTE — Progress Notes (Signed)
   Subjective:  No further bleeding reported.  Still having some pain in her lower back with occasional contractions.  +FM  Objective:   Vitals: Blood pressure 108/66, pulse 90, temperature 97.9 F (36.6 C), temperature source Oral, resp. rate 16, height 5\' 1"  (1.549 m), weight 69.4 kg, last menstrual period 12/09/2018, SpO2 97 %. General: NAD Abdomen: gravid, non-tender Cervical Exam:  Dilation: 1.5 Effacement (%): 70, 80 Station: -1 Presentation: Vertex Exam by:: [email or web address]  FHT 140, moderate, +accels, no decels Toco none  Results for orders placed or performed during the hospital encounter of 07/28/19 (from the past 24 hour(s))  SARS Coronavirus 2 by RT PCR (hospital order, performed in [facility] hospital lab) Nasopharyngeal Nasopharyngeal Swab     Status: None   Collection Time: 07/29/19  2:57 PM   Specimen: Nasopharyngeal Swab  Result Value Ref Range   SARS Coronavirus 2 NEGATIVE NEGATIVE  CBC     Status: Abnormal   Collection Time: 07/29/19 11:57 PM  Result Value Ref Range   WBC 11.5 (H) 4.0 - 10.5 K/uL   RBC 3.63 (L) 3.87 - 5.11 MIL/uL   Hemoglobin 11.1 (L) 12.0 - 15.0 g/dL   HCT 07/31/19 (L) 36 - 46 %   MCV 88.2 80.0 - 100.0 fL   MCH 30.6 26.0 - 34.0 pg   MCHC 34.7 30.0 - 36.0 g/dL   RDW 01.0 93.2 - 35.5 %   Platelets 201 150 - 400 K/uL   nRBC 0.0 0.0 - 0.2 %    Assessment:   29 y.o. G2P0101 [redacted]w[redacted]d with placental abruption  Plan:   1) Abruption  - Continue inpatient monitoring -  no further bright red bleeding noted - Growth, AFI, MCA doppler normal, decrease in repeat KB with stable CBC and fibrinogen - s/p BMZ x 2 - Per MFM would want patient to stop having painful contraction prior to discharge.    2) Fetus - cat It racing  [redacted]w[redacted]d, MD, Vena Austria OB/GY[facility], [facility] 07/30/2019, 1:38 PM

## 2019-07-30 NOTE — Progress Notes (Addendum)
S:  Doing well. She is tolerating regular diet. She reports contractions have lessened in both intensity and frequency since she was admitted. She denies any current symptoms of tachycardia or palpitations. She has not had any bleeding since admission. She reports good fetal movement. She has had a scant amount of yellow colored discharge- possibly urine. We discussed plan of care and plan has been discussed with Dr Jean Rosenthal.   O: Vital Signs: BP 108/66 (BP Location: Right Arm)   Pulse 90   Temp 97.9 F (36.6 C) (Oral)   Resp 16   Ht 5\' 1"  (1.549 m)   Wt 69.4 kg   LMP 12/09/2018   SpO2 97%   BMI 28.91 kg/m  Constitutional: Well nourished, well developed female in no acute distress.  HEENT: normal Skin: Warm and dry.  Cardiovascular: Regular rate and rhythm.   Extremity: no edema  Respiratory: Clear to auscultation bilateral. Normal respiratory effort Abdomen: FHT present Back: no CVAT Neuro: DTRs 2+, Cranial nerves grossly intact Psych: Alert and Oriented x3. No memory deficits. Normal mood and affect.  MS: normal gait, normal bilateral lower extremity ROM/strength/stability.  Pelvic exam: deferred   NST: 140 bpm, moderate variability, +accelerations, -decelerations Toco: rare contractions on NST this morning. Mild to palpation. She is currently on NST prior to transfer to antepartum.  A: 29 y.o. G2 P0101 with IUP at 33 weeks 2 days, chronic abruption, FH tracing reactive, labs stable  P: Transfer to antepartum NST q 8 hours Labs as needed Regular Diet Up to ambulate in room GBS+ on 7/15 Covid negative on 7/16 S/P BMZ x2 Transfer back to L&D for any concerning s/s of abruption Disposition: pending delivery Plan of care discussed with Dr 8/16, CNM

## 2019-07-30 NOTE — Progress Notes (Signed)
Pt requested to get fresh air and would like to see son. Tresea Mall CNM called- OK for pt to be wheeled down by RN to get fresh air and visit son if remains stable (no contractions, no vaginal bleeding, VSS, etc.). Pt updated and thanked staff.

## 2019-07-30 NOTE — Progress Notes (Signed)
Pt returned to mother baby unit after reactive cat 1 NST with accels and no decels. Pt states she has not had any vaginal bleeding and has occasional ctx but not like they were at initial admission. Pt advised to let [facility] nurse know if she feels any consistent cramping or painful ctx or experiences any more bleeding. Pt verbalized understanding. No further needs at this time.

## 2019-07-31 MED ORDER — PROGESTERONE 200 MG VA SUPP: 200.0000 mg | Freq: Every day | VAGINAL | Status: DC

## 2019-07-31 NOTE — Discharge Summary (Signed)
Physician Discharge Summary  Patient ID: Dorothy Townsend MRN: [id number] DOB/AGE: [birth year] 29 y.o.  Admit date: 07/28/2019 Discharge date: 07/31/2019  Admission Diagnoses:  Discharge Diagnoses:  Active Problems:   Vaginal bleeding in pregnancy, third trimester   [redacted] weeks gestation of pregnancy   Placental abruption in third trimester   Discharged Condition: stable  Hospital Course: 29 y.o. G2P0101 at [redacted]w[redacted]d by Estimated Date of Delivery: 09/15/19 presenting on 07/28/2019 from clinic after experiencing and episode of bright red vagina bleeding. The patient has had intermittent light spotting throughout the third trimester dating back to May.  She had a marginal posterior placenta previa on early scans which resolved on follow up imaging.  On admission bleeding has subsided but some scant bleeding was noted at the cervical os. The patient was contracting painfully on presentation.  Labs were normal including ROM plus but KB was positive consistent with placental abruption.  The patient was monitored to 72-hrs without further bleeding episodes and reassuring fetal monitoring.  The patient is Rh positive and rhogam was not indicated, she received BMZ x 2 during this admission.  We will hold vaginal progesterone going forward.  Growth scan and MCA dopplers were also obtained this admission and normal.  She will be on modified bed rest and pelvic rest at home going froward with clinic follow up for twice weekly NST or BPP.  Per MFM no need for follow up MCA dopplers.  If patient remains stable delivery between 36-[redacted] weeks gestation.  The patient has reliable transportation and lives approximately 5 minutes from the hospital.  She is aware that any vaginal bleeding or significant abdominal pain warrants re-presentation.  She was instructed on home kick counts.    Consults: MFM  Significant Diagnostic Studies:  Results for orders placed or performed during the hospital encounter of 07/28/19 (from the  past 72 hour(s))  CBC     Status: Abnormal   Collection Time: 07/29/19  7:34 AM  Result Value Ref Range   WBC 11.8 (H) 4.0 - 10.5 K/uL   RBC 3.66 (L) 3.87 - 5.11 MIL/uL   Hemoglobin 11.1 (L) 12.0 - 15.0 g/dL   HCT 40.9 (L) 36 - 46 %   MCV 86.6 80.0 - 100.0 fL   MCH 30.3 26.0 - 34.0 pg   MCHC 35.0 30.0 - 36.0 g/dL   RDW 81.1 91.4 - 78.2 %   Platelets 193 150 - 400 K/uL   nRBC 0.0 0.0 - 0.2 %    Comment: Performed at [facility], [address] [place], [place] [address]  Fibrinogen     Status: None   Collection Time: 07/29/19  7:34 AM  Result Value Ref Range   Fibrinogen 380 210 - 475 mg/dL    Comment: Performed at [facility], [address] [place], [place] [address]  Kleihauer-Betke stain     Status: None   Collection Time: 07/29/19  7:34 AM  Result Value Ref Range   Fetal Cells % 0.5 %   Quantitation Fetal Hemoglobin 0.0025 mL    Comment: UP TO 15 MLS   # Vials RhIg NOT INDICATED     Comment: CONFIRMED BY JAG Performed at [facility], [address] [place], [place] [address]   SARS Coronavirus 2 by RT PCR (hospital order, performed in [facility] hospital lab) Nasopharyngeal Nasopharyngeal Swab     Status: None   Collection Time: 07/29/19  2:57 PM   Specimen: Nasopharyngeal Swab  Result Value Ref Range  SARS Coronavirus 2 NEGATIVE NEGATIVE    Comment: (NOTE) SARS-CoV-2 target nucleic acids are NOT DETECTED.  The SARS-CoV-2 RNA is generally detectable in upper and lower respiratory specimens during the acute phase of infection. The lowest concentration of SARS-CoV-2 viral copies this assay can detect is 250 copies / mL. A negative result does not preclude SARS-CoV-2 infection and should not be used as the sole basis for treatment or other patient management decisions.  A negative result may occur with improper specimen collection / handling, submission of specimen other than nasopharyngeal swab, presence of viral mutation(s) within  the areas targeted by this assay, and inadequate number of viral copies (<250 copies / mL). A negative result must be combined with clinical observations, patient history, and epidemiological information.  Fact Sheet for Patients:   BoilerBrush.com.cy  Fact Sheet for Healthcare Providers: https://pope.com/  This test is not yet approved or  cleared by the [place] FDA and has been authorized for detection and/or diagnosis of SARS-CoV-2 by FDA under an Emergency Use Authorization (EUA).  This EUA will remain in effect (meaning this test can be used) for the duration of the COVID-19 declaration under Section 564(b)(1) of the Act, 21 U.S.C. section 360bbb-3(b)(1), unless the authorization is terminated or revoked sooner.  Performed at [facility], [address] [place], [place] [address]   CBC     Status: Abnormal   Collection Time: 07/29/19 11:57 PM  Result Value Ref Range   WBC 11.5 (H) 4.0 - 10.5 K/uL   RBC 3.63 (L) 3.87 - 5.11 MIL/uL   Hemoglobin 11.1 (L) 12.0 - 15.0 g/dL   HCT 81.1 (L) 36 - 46 %   MCV 88.2 80.0 - 100.0 fL   MCH 30.6 26.0 - 34.0 pg   MCHC 34.7 30.0 - 36.0 g/dL   RDW 91.4 78.2 - 95.6 %   Platelets 201 150 - 400 K/uL   nRBC 0.0 0.0 - 0.2 %    Comment: Performed at [facility], [address] [place], [place] [address]   [place] MFM MCA DOPPLER  Result Date: 07/29/2019 ----------------------------------------------------------------------  OBSTETRICS REPORT                       (Signed Final 07/29/2019 05:18 pm) ---------------------------------------------------------------------- Patient Info  ID #:       [id number]                          D.O.B.:  [birth year] (28 yrs)  Name:       Dorothy Townsend               Visit Date: 07/29/2019 04:10 pm ---------------------------------------------------------------------- Performed By  Attending:        Lin Landsman      Ref. Address:     [address]                    [address]                                                             [address], [place],  [place] [address]  Performed By:     Neita Carp             [facility]                                                             at [place]                                                             [place]  Referred By:      Conard Novak MD ---------------------------------------------------------------------- Orders  #  Description                           Code        Ordered By  1  [place] MFM MCA DOPPLER                    [phone]    [name]  2  [place] MFM OB COMP + 14 WK                76805.01    [name] ----------------------------------------------------------------------  #  Order #                     Accession #                Episode #  1  [id number]                   [id number]                 [id number]  2  [id number]                   [id number]                 [id number] ---------------------------------------------------------------------- Indications  [redacted] weeks gestation of pregnancy                Z3A.33  Placental abruption                            O45.90 ---------------------------------------------------------------------- Fetal Evaluation  Num Of Fetuses:         1  Fetal Heart Rate(bpm):  137  Cardiac Activity:       Observed  Presentation:           Cephalic  Placenta:               Posterior  P. Cord Insertion:      Visualized, central  AFI Sum(cm)     %Tile       Largest Pocket(cm)  18.4            68          5.7  RUQ(cm)       RLQ(cm)       LUQ(cm)        LLQ(cm)  5.7           3.8           4.4            4.5 ---------------------------------------------------------------------- Biometry  BPD:      85.8  mm     G. Age:  34w 4d         83  %    CI:        77.29   %    70 - 86                                                           FL/HC:      20.3   %    19.9 - 21.5  HC:       309   mm     G. Age:  34w 3d         48  %    HC/AC:      1.07        0.96 - 1.11  AC:      289.3  mm     G. Age:  33w 0d         45  %    FL/BPD:     73.0   %    71 - 87  FL:       62.6  mm     G. Age:  32w 3d         20  %    FL/AC:      21.6   %    20 - 24  HUM:     54.03  mm     G. Age:  31w 3d         25  %  Est. FW:    2116  gm    4 lb 11 oz      39  % ---------------------------------------------------------------------- Gestational Age  LMP:           33w 1d        Date:  12/09/18                 EDD:   09/15/19  U/S Today:     33w 4d                                        EDD:   09/12/19  Best:          33w 1d     Det. By:  LMP  (12/09/18)          EDD:   09/15/19 ---------------------------------------------------------------------- Anatomy  Cranium:               Appears normal  Aortic Arch:            Appears normal  Cavum:                 Appears normal         Ductal Arch:            Appears normal  Ventricles:            Appears normal         Diaphragm:              Appears normal  Choroid Plexus:        Appears normal         Stomach:                Appears normal, left                                                                        sided  Cerebellum:            Appears normal         Abdomen:                Appears normal  Posterior Fossa:       Not well visualized    Abdominal Wall:         Appears nml (cord                                                                        insert, abd wall)  Nuchal Fold:           Not applicable (>20    Cord Vessels:           Appears normal ([redacted]                         wks GA)                                        vessel cord)  Face:                  Appears normal         Kidneys:                Appear normal                         (orbits and profile)  Lips:                  Appears normal         Bladder:                Appears normal  Thoracic:               Appears normal         Spine:  Not well visualized  Heart:                 Appears normal         Upper Extremities:      Appears normal                         (4CH, axis, and                         situs)  RVOT:                  Appears normal         Lower Extremities:      Appears normal  LVOT:                  Appears normal  Other:  Spine poorly seen due to fetal position. ---------------------------------------------------------------------- Doppler - Fetal Vessels  Middle Cerebral Artery                                       PI    %tile     PSV   MoM                                                     (cm/s)                                     1.66        9     51.6  1.11 ---------------------------------------------------------------------- Impression  Dorothy Townsend is seen in the hospital for vaginal bleeding and  uterine contraction with clinically suspected placental  abruption and fetal maternal hemorrhage with a KB of 5 and  now .5.  Her last growth was appropriate as of April 2021.  Normal interval growth was observed with an EFW at the  39%.  I discussed today's findings and management with Dr.  Jean Rosenthal. KB has overall low clincal predictive power as  compared to MCA dopplers as it relates to predicing anemia.  The MCA dopplers were performed with a MOM of < 1 to 1.1  with the PSV. They were all less the 1.5 MoM. The placenta  appears intact with no evidence of placental abruption ,  however, placental abruption is a clinical diagnosis. ---------------------------------------------------------------------- Recommendations  Continue inpatient management as Dorothy Townsend is continuing  to contract.  Consider discharge 48 hour after her last  contraction/bleeding.  Complete course of steroids. ----------------------------------------------------------------------               Lin Landsman, MD Electronically Signed Final Report   07/29/2019 05:18 pm  ----------------------------------------------------------------------  [place] MFM OB COMP + 14 WK  Result Date: 07/29/2019 ----------------------------------------------------------------------  OBSTETRICS REPORT                       (Signed Final 07/29/2019 05:18 pm) ---------------------------------------------------------------------- Patient Info  ID #:       [id number]                          D.O.B.:  [birth year] (28 yrs)  Name:       Dorothy Townsend               Visit Date: 07/29/2019 04:10 pm ---------------------------------------------------------------------- Performed By  Attending:        Lin Landsmanorenthian Booker      Ref. Address:     [address]                    [place]                                                             [address], [place],                                                             [place] [id number]  Performed By:     Neita CarpMaria Hill             Location:         [facility]                                                             at [place]                                                             [place]  Referred By:      Conard NovakSTEPHEN D                    JACKSON MD ---------------------------------------------------------------------- Orders  #  Description                           Code        Ordered By  1  US MFM MCA DOPPLER                    [id number]    STEPHEN JACKSON  2  US MFM OB COMP + 14 WK                [id number]    Thomasene MohairSTEPHEN JACKSON ----------------------------------------------------------------------  #  Order #                     Accession #  Episode #  1  [id number]                   [id number]                 [id number]  2  [id number]                   [id number]                 [id number] ---------------------------------------------------------------------- Indications  [redacted] weeks gestation of pregnancy                Z3A.33  Placental abruption                            O45.90  ---------------------------------------------------------------------- Fetal Evaluation  Num Of Fetuses:         1  Fetal Heart Rate(bpm):  137  Cardiac Activity:       Observed  Presentation:           Cephalic  Placenta:               Posterior  P. Cord Insertion:      Visualized, central  AFI Sum(cm)     %Tile       Largest Pocket(cm)  18.4            68          5.7  RUQ(cm)       RLQ(cm)       LUQ(cm)        LLQ(cm)  5.7           3.8           4.4            4.5 ---------------------------------------------------------------------- Biometry  BPD:      85.8  mm     G. Age:  34w 4d         83  %    CI:        77.29   %    70 - 86                                                          FL/HC:      20.3   %    19.9 - 21.5  HC:       309   mm     G. Age:  34w 3d         48  %    HC/AC:      1.07        0.96 - 1.11  AC:      289.3  mm     G. Age:  33w 0d         45  %    FL/BPD:     73.0   %    71 - 87  FL:       62.6  mm     G. Age:  32w 3d         20  %    FL/AC:      21.6   %    20 - 24  HUM:     54.03  mm  G. Age:  31w 3d         25  %  Est. FW:    2116  gm    4 lb 11 oz      39  % ---------------------------------------------------------------------- Gestational Age  LMP:           33w 1d        Date:  12/09/18                 EDD:   09/15/19  U/S Today:     33w 4d                                        EDD:   09/12/19  Best:          33w 1d     Det. By:  LMP  (12/09/18)          EDD:   09/15/19 ---------------------------------------------------------------------- Anatomy  Cranium:               Appears normal         Aortic Arch:            Appears normal  Cavum:                 Appears normal         Ductal Arch:            Appears normal  Ventricles:            Appears normal         Diaphragm:              Appears normal  Choroid Plexus:        Appears normal         Stomach:                Appears normal, left                                                                        sided  Cerebellum:             Appears normal         Abdomen:                Appears normal  Posterior Fossa:       Not well visualized    Abdominal Wall:         Appears nml (cord                                                                        insert, abd wall)  Nuchal Fold:           Not applicable (>20    Cord Vessels:           Appears normal ([redacted]  wks GA)                                        vessel cord)  Face:                  Appears normal         Kidneys:                Appear normal                         (orbits and profile)  Lips:                  Appears normal         Bladder:                Appears normal  Thoracic:              Appears normal         Spine:                  Not well visualized  Heart:                 Appears normal         Upper Extremities:      Appears normal                         (4CH, axis, and                         situs)  RVOT:                  Appears normal         Lower Extremities:      Appears normal  LVOT:                  Appears normal  Other:  Spine poorly seen due to fetal position. ---------------------------------------------------------------------- Doppler - Fetal Vessels  Middle Cerebral Artery                                       PI    %tile     PSV   MoM                                                     (cm/s)                                     1.66        9     51.6  1.11 ---------------------------------------------------------------------- Impression  Dorothy Townsend is seen in the hospital for vaginal bleeding and  uterine contraction with clinically suspected placental  abruption and fetal maternal hemorrhage with a KB of 5 and  now .5.  Her last growth was appropriate as of April 2021.  Normal interval growth was observed with an EFW at the  39%.  I discussed today's findings and management with  Dr.  Jean Rosenthal. KB has overall low clincal predictive power as  compared to MCA dopplers as it relates to predicing anemia.  The MCA dopplers were  performed with a MOM of < 1 to 1.1  with the PSV. They were all less the 1.5 MoM. The placenta  appears intact with no evidence of placental abruption ,  however, placental abruption is a clinical diagnosis. ---------------------------------------------------------------------- Recommendations  Continue inpatient management as Dorothy Townsend is continuing  to contract.  Consider discharge 48 hour after her last  contraction/bleeding.  Complete course of steroids. ----------------------------------------------------------------------               Lin Landsman, MD Electronically Signed Final Report   07/29/2019 05:18 pm ----------------------------------------------------------------------    Treatments: IV hydration and steroids: BMZ 7/15 & 7/16  Discharge Exam: Blood pressure 134/71, pulse 92, temperature 98.6 F (37 C), resp. rate 18, height 5\' 1"  (1.549 m), weight 69.4 kg, last menstrual period 12/09/2018, SpO2 99 %. General appearance: alert, appears stated age and no distress Resp: no increased work of breathing GI: gravid, soft, non-tender, non-distended Extremities: extremities normal, atraumatic, no cyanosis or edema  Disposition: Discharge disposition: 01-Home or Self Care       Discharge Instructions    Discharge activity:  Bathroom / Shower only   Complete by: As directed    Discharge diet:  No restrictions   Complete by: As directed    Do not have sex or do anything that might make you have an orgasm   Complete by: As directed    Fetal Kick Count:  Lie on our left side for one hour after a meal, and count the number of times your baby kicks.  If it is less than 5 times, get up, move around and drink some juice.  Repeat the test 30 minutes later.  If it is still less than 5 kicks in an hour, notify your doctor.   Complete by: As directed    Notify physician for a general feeling that "something is not right"   Complete by: As directed    Notify physician for increase or  change in vaginal discharge   Complete by: As directed    Notify physician for intestinal cramps, with or without diarrhea, sometimes described as "gas pain"   Complete by: As directed    Notify physician for leaking of fluid   Complete by: As directed    Notify physician for low, dull backache, unrelieved by heat or Tylenol   Complete by: As directed    Notify physician for menstrual like cramps   Complete by: As directed    Notify physician for pelvic pressure   Complete by: As directed    Notify physician for uterine contractions.  These may be painless and feel like the uterus is tightening or the baby is  "balling up"   Complete by: As directed    Notify physician for vaginal bleeding   Complete by: As directed    PRETERM LABOR:  Includes any of the follwing symptoms that occur between 20 - [redacted] weeks gestation.  If these symptoms are not stopped, preterm labor can result in preterm delivery, placing your baby at risk   Complete by: As directed      Allergies as of 07/31/2019      Reactions   Fluvoxamine Anxiety      Medication List    STOP taking these medications   progesterone 200 MG Supp     TAKE these medications  PRENATAL VITAMIN PO Take by mouth.       Follow-up Information    Conard Novak, MD. Go on 08/01/2019.   Specialty: Obstetrics and Gynecology Why: 2:30 PM Contact information: [address] [place] [address] [phone]               Signed: Vena Austria 07/31/2019, 12:52 PM

## 2019-07-31 NOTE — Progress Notes (Signed)
Per Dr. Bonney Aid okay for patient to leave floor to visit son in the medical mall without nurse present. Patient given mother baby's floor number and instructed to call if she notices any contractions, bleeding, or any other symptoms. Pt verbalized understanding.

## 2019-07-31 NOTE — Progress Notes (Signed)
Patient ID: Dorothy Townsend, female   DOB: [birth year], 29 y.o.   MRN: [id number] Daily Antepartum Note  Admission Date: 07/28/2019 Current Date: 07/31/2019 10:25 AM  Dorothy Townsend is a 29 y.o. G2P0101 @ [redacted]w[redacted]d by LMP and ultrasound,, admitted for vaginal bleeding secondary to a diagnosed abruption that is being followed closely at [facility]. .she is a patient of Dr. [name]. Admitted for fetal monitoring and evaluation of some ongoing bleeding, hydration with IV fluids and NSTs.  Pregnancy complicated by: See below Patient Active Problem List   Diagnosis Date Noted  . Placental abruption in third trimester 07/29/2019  . [redacted] weeks gestation of pregnancy 07/28/2019  . Vaginal bleeding in pregnancy, third trimester 07/05/2019  . Vaginal bleeding during pregnancy 06/10/2019  . Placenta previa antepartum 04/20/2019  . Constipation during pregnancy 02/14/2019  . Constipation 01/28/2019  . Supervision of high risk pregnancy, antepartum 01/28/2019  . History of preterm delivery, currently pregnant 01/28/2019  . GAD (generalized anxiety disorder) 07/27/2014  . Irritable bowel syndrome with constipation 07/27/2014  . Screening cholesterol level 07/27/2014  . Other fatigue 07/27/2014    Overnight/24hr events:  She has done well overnight; she reports no spotting or LOF. Her baby is moving well. An NST performed yesterday had shown some irregular pattern with an episode of elevated FHTS at a time with the patient was experiencing some tachycardia. This resolved spontaneously, and has not reoccurred.  Subjective:  She denies feeling contractions; denies any LOF or vaginal bleeding. Her baby is moving well. She is asking about the POC, and whether she may be discharged tomorrow.  Objective:   Vitals:   07/31/19 0015 07/31/19 0817  BP: (!) 105/54 113/71  Pulse: 79 79  Resp: 18 20  Temp: 98.3 F (36.8 C) 98.4 F (36.9 C)  SpO2: 97% 100%   Temp:  [97.8 F (36.6 C)-98.4 F  (36.9 C)] 98.4 F (36.9 C) (07/18 0817) Pulse Rate:  [79-95] 79 (07/18 0817) Resp:  [16-20] 20 (07/18 0817) BP: (105-126)/(50-72) 113/71 (07/18 0817) SpO2:  [97 %-100 %] 100 % (07/18 0817) Temp (24hrs), Avg:98.1 F (36.7 C), Min:97.8 F (36.6 C), Max:98.4 F (36.9 C)   Intake/Output Summary (Last 24 hours) at 07/31/2019 1025 Last data filed at 07/31/2019 0300 Gross per 24 hour  Intake 2000 ml  Output --  Net 2000 ml     Current Vital Signs 24h Vital Sign Ranges  T 98.4 F (36.9 C) Temp  Avg: 98.1 F (36.7 C)  Min: 97.8 F (36.6 C)  Max: 98.4 F (36.9 C)  BP 113/71 BP  Min: 105/54  Max: 126/72  HR 79 Pulse  Avg: 88  Min: 79  Max: 95  RR 20 Resp  Avg: 17.4  Min: 16  Max: 20  SaO2 100 % Room Air SpO2  Avg: 98.7 %  Min: 97 %  Max: 100 %       24 Hour I/O Current Shift I/O  Time Ins Outs 07/17 0701 - 07/18 0700 In: 2000 [I.V.:2000] Out: -  No intake/output data recorded.   Patient Vitals for the past 24 hrs:  BP Temp Temp src Pulse Resp SpO2  07/31/19 0817 113/71 98.4 F (36.9 C) Oral 79 20 100 %  07/31/19 0015 (!) 105/54 98.3 F (36.8 C) Oral 79 18 97 %  07/30/19 1645 126/72 98 F (36.7 C) Oral 95 17 99 %  07/30/19 1523 126/64 97.8 F (36.6 C) Oral 95 16 --  07/30/19 1232 Marland Kitchen)  113/50 98 F (36.7 C) Oral 92 16 --    Physical exam: General: Well nourished, well developed female in no acute distress. Abdomen: gravid , soft, bowels sound auscultated all four quadrants.  Cardiovascular: S1, S2 normal, no murmur, rub or gallop, regular rate and rhythm Respiratory: CTAB Extremities: no clubbing, cyanosis or edema Skin: Warm and dry.   Medications: Current Facility-Administered Medications  Medication Dose Route Frequency Provider Last Rate Last Admin  . famotidine (PEPCID) tablet 20 mg  20 mg Oral BID PRN Farrel Conners, CNM   20 mg at 07/30/19 1239  . fentaNYL (SUBLIMAZE) injection 50-100 mcg  50-100 mcg Intravenous Q1H PRN Vena Austria, MD      .  lactated ringers infusion   Intravenous Continuous Mirna Mires, CNM 75 mL/hr at 07/30/19 2125 New Bag at 07/30/19 2125  . prenatal multivitamin tablet 1 tablet  1 tablet Oral Q1200 Conard Novak, MD   1 tablet at 07/30/19 1239  . progesterone suppository 200 mg  200 mg Vaginal QHS Tresea Mall, CNM        Labs:  Recent Labs  Lab 07/28/19 1122 07/29/19 0734 07/29/19 2357  WBC 8.4 11.8* 11.5*  HGB 11.7* 11.1* 11.1*  HCT 32.4* 31.7* 32.0*  PLT 180 193 201    Recent Labs  Lab 07/28/19 1122  NA 135  K 3.8  CL 103  CO2 26  BUN 5*  CREATININE 0.37*  CALCIUM 8.7*  PROT 6.3*  BILITOT 0.5  ALKPHOS 92  ALT 13  AST 18  GLUCOSE 83     Radiology:  NA  Assessment & Plan:  IUP 33 weeks with resolved posterior marginal previa, suspected abruption, episode of vaginal bleeding Reactive NST Stable. Continue daily NSTs, current orders. She may visit family in waiting area downstairs if taken in a wheelchair.  Mirna Mires, CNM  07/31/2019 10:37 AM

## 2019-07-31 NOTE — Progress Notes (Signed)
   Subjective:  Doing well no further bleeding  Objective:   Vitals: Blood pressure 113/71, pulse 79, temperature 98.4 F (36.9 C), temperature source Oral, resp. rate 20, height 5\' 1"  (1.549 m), weight 69.4 kg, last menstrual period 12/09/2018, SpO2 100 %. General: NAD Abdomen: gravid, non-tender Cervical Exam:  Dilation: 1.5 Effacement (%): 70, 80 Station: -1 Presentation: Vertex Exam by:: [email or web address]  FHT: 150, moderate, +accels, no decels Toco: mild irritability two contractions visible in 30 minutes tracing  No results found for this or any previous visit (from the past 24 hour(s)).  Assessment:   29 y.o. G2P0101 [redacted]w[redacted]d with placental abruption, stable  Plan:   1) Plancental abruption - s/p BMZ 7/15 & 7/16 - No further vaginal bleeding since 7/15 however has had off an on bleeding since mid May. - CBC, fibrinogen stable in the first 24-hrs, not rechecked as no further bleeding - KB positive 7/15 decreasing on recheck 7/16 - Growth 7/16 2115g or 4lbs 11oz c/w 38%ile, AFI 18.4cm, MCA doppler normal with normal imaged placenta  2) Fetus - twice daily NST  3) Disposition -   09-29-1967, MD, Vena Austria OB/[facility]N, [facility] 07/31/2019, 11:22 AM

## 2019-07-31 NOTE — Discharge Instructions (Signed)
Placental Abruption  The placenta is the organ formed during pregnancy. It carries oxygen and nutrients to the unborn baby (fetus). The placenta is the baby's life support system. In normal circumstances, it remains attached to the inside of the uterus until the baby is born. Placental abruption is a condition in which the placenta partly or completely separates from the uterus before the baby is born. Placental abruption is rare, but it can happen any time after 20 weeks of pregnancy. A small separation may not cause problems, but a large separation may be dangerous for you and your baby. A large separation is usually an emergency that requires treatment right away. What are the causes? In most cases, the cause of this condition is not known. What increases the risk? This condition is more likely to develop in women who:  Have experienced a recent trauma such as a fall, an injury to the abdomen, or a car accident.  Have had a previous placental abruption.  Have high blood pressure.  Smoke cigarettes, use alcohol, or use drugs, such as cocaine.  Have multiples (twins, triplets, or more).  Have too much amniotic fluid (polyhydramnios).  Are 35 years of age or older. What are the signs or symptoms? Symptoms of this condition can be mild or severe. A small placental abruption may not cause symptoms, or it may cause mild symptoms, which may include:  Mild pain in the abdomen or lower back.  Slight bleeding in the vagina. A severe placental abruption will cause symptoms. The symptoms will depend on the size of the separation and the stage of pregnancy. They may include:  Severe pain in the abdomen or lower back.  Bleeding from the vagina.  Ongoing contractions of your uterus with little or no relaxation of your uterus between contractions. How is this diagnosed? There are no tests to diagnose placental abruption. However, your health care provider may suspect that you have this  condition based on:  Your symptoms.  A physical exam.  Ultrasound findings.  Blood tests. How is this treated? Treatment for placental abruption depends on the severity of the condition.  Mild cases may be monitored through close observation. You may be admitted to the hospital during this time.  Severe cases may require emergency treatment. This may involve: ? Admission to the hospital. ? Emergency cesarean delivery of your baby. ? A blood transfusion or other fluids given through an IV. Follow these instructions at home: Activity  Get plenty of rest and sleep.  Do not have sex until your health care provider says it is okay. Lifestyle  Do not use drugs.  Do not drink alcohol.  Do not use tampons or douche unless your health care provider says it is okay.  Do not use any products that contain nicotine or tobacco, such as cigarettes, e-cigarettes, and chewing tobacco. If you need help quitting, ask your health care provider. General instructions  Take over-the-counter and prescription medicines only as told by your health care provider.  Do not take any medicines that your health care provider has not approved.  Arrange for help at home so you can get adequate rest.  Keep all follow-up visits as told by your health care provider. This is important. Contact a health care provider if:  You have vaginal spotting.  You are having mild, regular contractions. Get help right away if:  You have moderate or heavy vaginal bleeding or spotting.  You have severe pain in the abdomen.  You have continuous uterine   contractions.  You have a hard, tender uterus.  You have any type of trauma, such as an injury to the abdomen, a fall, or a car accident.  You do not feel the baby move, or the baby moves very little. Summary  Placental abruption is a condition in which the placenta partly or completely separates from the uterus before the baby is born.  Placental abruption  is a medical emergency that requires treatment right away.  Contact a health care provider if you have vaginal bleeding, or if you have mild, regular contractions.  Get help right away if you have heavy vaginal bleeding, severe pain in the abdomen, continuous uterine contractions, or you do not feel the baby move.  Keep all follow-up visits as told by your health care provider. This is important. This information is not intended to replace advice given to you by your health care provider. Make sure you discuss any questions you have with your health care provider. Document Revised: 06/24/2018 Document Reviewed: 06/24/2018 Elsevier Patient Education  2020 [employer]  

## 2019-07-31 NOTE — Progress Notes (Signed)
Pt discharged home. Discharge instructions, prescriptions, and follow up appointments given to and reviewed with pt. Pt verbalized understanding. Escorted by staff.   

## 2019-08-01 ENCOUNTER — Other Ambulatory Visit: Payer: 59

## 2019-08-01 ENCOUNTER — Other Ambulatory Visit: Payer: Self-pay

## 2019-08-01 ENCOUNTER — Ambulatory Visit (INDEPENDENT_AMBULATORY_CARE_PROVIDER_SITE_OTHER): Payer: 59 | Admitting: Obstetrics and Gynecology

## 2019-08-01 ENCOUNTER — Encounter: Payer: Self-pay | Admitting: Obstetrics and Gynecology

## 2019-08-01 VITALS — BP 122/74 | Wt 155.0 lb

## 2019-08-01 DIAGNOSIS — O0993 Supervision of high risk pregnancy, unspecified, third trimester: Secondary | ICD-10-CM | POA: Diagnosis not present

## 2019-08-01 DIAGNOSIS — Z3A33 33 weeks gestation of pregnancy: Secondary | ICD-10-CM

## 2019-08-01 DIAGNOSIS — O4593 Premature separation of placenta, unspecified, third trimester: Secondary | ICD-10-CM

## 2019-08-01 DIAGNOSIS — O09899 Supervision of other high risk pregnancies, unspecified trimester: Secondary | ICD-10-CM

## 2019-08-01 NOTE — Progress Notes (Addendum)
Routine Prenatal Care Visit  Subjective  Dorothy Townsend is a 29 y.o. G2P0101 at [redacted]w[redacted]d being seen today for ongoing prenatal care.  She is currently monitored for the following issues for this high-risk pregnancy and has GAD (generalized anxiety disorder); Irritable bowel syndrome with constipation; Screening cholesterol level; Other fatigue; Constipation; Supervision of high risk pregnancy, antepartum; History of preterm delivery, currently pregnant; Constipation during pregnancy; Placenta previa antepartum; Vaginal bleeding during pregnancy; Vaginal bleeding in pregnancy, third trimester; [redacted] weeks gestation of pregnancy; and Placental abruption in third trimester on their problem list.  ----------------------------------------------------------------------------------- Patient reports no complaints.  Recent hospitalization for partial placental abruption.  Stable now per patient report. Only occasional mild contractions. No bleeding since yesterday.  Growth u/s performed in hospital was normal. Due to concern for significant bleed due to high percentage KB result, MCA dopplers were performed and interpreted by MFM and were called normal, indicated no fetal anemia.  She was discharged yesterday since her bleeding and contracting had stopped.  Contractions: Not present. Vag. Bleeding: None.  Movement: Present. Leaking Fluid denies.  ----------------------------------------------------------------------------------- The following portions of the patient's history were reviewed and updated as appropriate: allergies, current medications, past family history, past medical history, past social history, past surgical history and problem list. Problem list updated.  Objective  Blood pressure 122/74, weight 155 lb (70.3 kg), last menstrual period 12/09/2018. Pregravid weight 131 lb (59.4 kg) Total Weight Gain 24 lb (10.9 kg) Urinalysis: Urine Protein    Urine Glucose    Fetal Status: Fetal Heart Rate  (bpm): 145 Fundal Height: 33 cm Movement: Present     General:  Alert, oriented and cooperative. Patient is in no acute distress.  Skin: Skin is warm and dry. No rash noted.   Cardiovascular: Normal heart rate noted  Respiratory: Normal respiratory effort, no problems with respiration noted  Abdomen: Soft, gravid, appropriate for gestational age. Pain/Pressure: Absent     Pelvic:  Cervical exam deferred        Extremities: Normal range of motion.  Edema: None  Mental Status: Normal mood and affect. Normal behavior. Normal judgment and thought content.   NST: Baseline FHR: 145 beats/min Variability: moderate Accelerations: present Decelerations: absent Tocometry: not done  Interpretation:  INDICATIONS: placental abruption, partial and stable.  RESULTS:  A NST procedure was performed with FHR monitoring and a normal baseline established, appropriate time of 20-40 minutes of evaluation, and accels >2 seen w 15x15 characteristics.  Results show a REACTIVE NST.    Assessment   29 y.o. G2P0101 at [redacted]w[redacted]d by  09/15/2019, by Last Menstrual Period presenting for routine prenatal visit  Plan   pregnancy Problems (from 01/28/19 to present)    Problem Noted Resolved   Vaginal bleeding during pregnancy 06/10/2019 by Conard Novak, MD No   Placenta previa antepartum 04/20/2019 by Conard Novak, MD No   Overview Signed 06/10/2019  9:15 AM by Conard Novak, MD    Resolved at 26 weeks      Constipation during pregnancy 02/14/2019 by Conard Novak, MD No   Constipation 01/28/2019 by Conard Novak, MD No   Supervision of high risk pregnancy, antepartum 01/28/2019 by Conard Novak, MD No   Overview Addendum 07/28/2019 12:27 PM by Vena Austria, MD    [facility] Prenatal Labs  Dating L=7 Blood type: B/Positive/-- (01/15 1255)   Genetic Screen 1 Screen:    AFP:     Quad:     NIPS: Antibody:Negative (01/15 1255)  Anatomic [place] complete Rubella: 9.10 (01/15 1255)   Varicella: Immune  GTT Third trimester: 129 RPR: Non Reactive (01/15 1255)   Rhogam n/a HBsAg: Negative (01/15 1255)   TDaP vaccine 07/13/2019  Flu Shot: HIV: Non Reactive (01/15 1255)   Baby Food                                GBS:   Contraception  Pap:  CBB     CS/VBAC    Support Person Azzie Glatter           Previous Version   History of preterm delivery, currently pregnant 01/28/2019 by Conard Novak, MD No   Overview Addendum 06/20/2019 11:31 AM by Conard Novak, MD    [x ] progesterone vaginal suppositories [x]  16 week cervical length u/s      Previous Version       Preterm labor symptoms and general obstetric precautions including but not limited to vaginal bleeding, contractions, leaking of fluid and fetal movement were reviewed in detail with the patient. Please refer to After Visit Summary for other counseling recommendations.   Continue twice weekly NSTs.  Discussed parameters to go to hospital including any bright red vaginal bleeding, contractions, large amount of brown blood, decreased fetal movement, severe abdominal pain, rupture of membranes, or ANY concerning symptom.   Return in about 4 days (around 08/05/2019) for Keep 7/23 app with SDJ, add NST, appt 7/26 & 7/29 AMS w NST, keep 8/2 appt SDJ with NST.  8/29, MD, [name] OB/GYN, [facility] 08/03/2019 11:27 AM

## 2019-08-03 ENCOUNTER — Encounter: Payer: Self-pay | Admitting: Obstetrics and Gynecology

## 2019-08-05 ENCOUNTER — Encounter: Payer: Self-pay | Admitting: Obstetrics and Gynecology

## 2019-08-05 ENCOUNTER — Ambulatory Visit (INDEPENDENT_AMBULATORY_CARE_PROVIDER_SITE_OTHER): Payer: 59 | Admitting: Obstetrics and Gynecology

## 2019-08-05 ENCOUNTER — Other Ambulatory Visit: Payer: Self-pay

## 2019-08-05 VITALS — BP 120/74 | Wt 154.0 lb

## 2019-08-05 DIAGNOSIS — O4593 Premature separation of placenta, unspecified, third trimester: Secondary | ICD-10-CM

## 2019-08-05 DIAGNOSIS — O09899 Supervision of other high risk pregnancies, unspecified trimester: Secondary | ICD-10-CM

## 2019-08-05 DIAGNOSIS — O0990 Supervision of high risk pregnancy, unspecified, unspecified trimester: Secondary | ICD-10-CM

## 2019-08-05 DIAGNOSIS — Z3A34 34 weeks gestation of pregnancy: Secondary | ICD-10-CM

## 2019-08-05 NOTE — Progress Notes (Signed)
Routine Prenatal Care Visit  Subjective  Dorothy Townsend is a 29 y.o. G2P0101 at [redacted]w[redacted]d being seen today for ongoing prenatal care.  She is currently monitored for the following issues for this high-risk pregnancy and has GAD (generalized anxiety disorder); Irritable bowel syndrome with constipation; Screening cholesterol level; Other fatigue; Constipation; Supervision of high risk pregnancy, antepartum; History of preterm delivery, currently pregnant; Constipation during pregnancy; Placenta previa antepartum; Vaginal bleeding during pregnancy; Vaginal bleeding in pregnancy, third trimester; [redacted] weeks gestation of pregnancy; and Placental abruption in third trimester on their problem list.  ----------------------------------------------------------------------------------- Patient reports no complaints.   Contractions: Not present. Vag. Bleeding: None.  Movement: Present. Leaking Fluid denies.  ----------------------------------------------------------------------------------- The following portions of the patient's history were reviewed and updated as appropriate: allergies, current medications, past family history, past medical history, past social history, past surgical history and problem list. Problem list updated.  Objective  Blood pressure 120/74, weight 154 lb (69.9 kg), last menstrual period 12/09/2018. Pregravid weight 131 lb (59.4 kg) Total Weight Gain 23 lb (10.4 kg) Urinalysis: Urine Protein    Urine Glucose    Fetal Status: Fetal Heart Rate (bpm): 150   Movement: Present  Presentation: Vertex  General:  Alert, oriented and cooperative. Patient is in no acute distress.  Skin: Skin is warm and dry. No rash noted.   Cardiovascular: Normal heart rate noted  Respiratory: Normal respiratory effort, no problems with respiration noted  Abdomen: Soft, gravid, appropriate for gestational age. Pain/Pressure: Absent     Pelvic:  Cervical exam deferred        Extremities: Normal range of  motion.  Edema: None  Mental Status: Normal mood and affect. Normal behavior. Normal judgment and thought content.   NST: Baseline FHR: 150 beats/min Variability: moderate Accelerations: present Decelerations: absent Tocometry: not done  Interpretation:  INDICATIONS: rule out uterine contractions, placental abruption in 3rd trimester RESULTS:  A NST procedure was performed with FHR monitoring and a normal baseline established, appropriate time of 20-40 minutes of evaluation, and accels >2 seen w 15x15 characteristics.  Results show a REACTIVE NST.    Assessment   29 y.o. G2P0101 at [redacted]w[redacted]d by  09/15/2019, by Last Menstrual Period presenting for routine prenatal visit  Plan   pregnancy Problems (from 01/28/19 to present)    Problem Noted Resolved   Vaginal bleeding during pregnancy 06/10/2019 by Conard Novak, MD No   Placenta previa antepartum 04/20/2019 by Conard Novak, MD No   Overview Signed 06/10/2019  9:15 AM by Conard Novak, MD    Resolved at 26 weeks      Constipation during pregnancy 02/14/2019 by Conard Novak, MD No   Constipation 01/28/2019 by Conard Novak, MD No   Supervision of high risk pregnancy, antepartum 01/28/2019 by Conard Novak, MD No   Overview Addendum 07/28/2019 12:27 PM by Vena Austria, MD    [facility] Prenatal Labs  Dating L=7 Blood type: B/Positive/-- (01/15 1255)   Genetic Screen 1 Screen:    AFP:     Quad:     NIPS: Antibody:Negative (01/15 1255)  Anatomic [place] complete Rubella: 9.10 (01/15 1255)  Varicella: Immune  GTT Third trimester: 129 RPR: Non Reactive (01/15 1255)   Rhogam n/a HBsAg: Negative (01/15 1255)   TDaP vaccine 07/13/2019  Flu Shot: HIV: Non Reactive (01/15 1255)   Baby Food  GBS:   Contraception  Pap:  CBB     CS/VBAC    Support Person Azzie Glatter           Previous Version   History of preterm delivery, currently pregnant 01/28/2019 by Conard Novak, MD  No   Overview Addendum 06/20/2019 11:31 AM by Conard Novak, MD    [x ] progesterone vaginal suppositories [x]  16 week cervical length u/s      Previous Version       Preterm labor symptoms and general obstetric precautions including but not limited to vaginal bleeding, contractions, leaking of fluid and fetal movement were reviewed in detail with the patient. Please refer to After Visit Summary for other counseling recommendations.   Return in 10 days (on 08/15/2019) for Keep ROB appts, add NST, if not already scheduled.  10/15/2019, MD, [name] OB/GYN, [facility] 08/05/2019 3:54 PM

## 2019-08-08 ENCOUNTER — Ambulatory Visit (INDEPENDENT_AMBULATORY_CARE_PROVIDER_SITE_OTHER): Payer: 59 | Admitting: Obstetrics and Gynecology

## 2019-08-08 ENCOUNTER — Other Ambulatory Visit: Payer: Self-pay

## 2019-08-08 VITALS — BP 104/60 | Wt 154.0 lb

## 2019-08-08 DIAGNOSIS — Z3A34 34 weeks gestation of pregnancy: Secondary | ICD-10-CM | POA: Diagnosis not present

## 2019-08-08 DIAGNOSIS — O0993 Supervision of high risk pregnancy, unspecified, third trimester: Secondary | ICD-10-CM

## 2019-08-08 DIAGNOSIS — O09899 Supervision of other high risk pregnancies, unspecified trimester: Secondary | ICD-10-CM

## 2019-08-08 DIAGNOSIS — O0990 Supervision of high risk pregnancy, unspecified, unspecified trimester: Secondary | ICD-10-CM

## 2019-08-08 DIAGNOSIS — O4593 Premature separation of placenta, unspecified, third trimester: Secondary | ICD-10-CM

## 2019-08-08 DIAGNOSIS — O09893 Supervision of other high risk pregnancies, third trimester: Secondary | ICD-10-CM

## 2019-08-08 DIAGNOSIS — O4693 Antepartum hemorrhage, unspecified, third trimester: Secondary | ICD-10-CM

## 2019-08-08 LAB — POCT URINALYSIS DIPSTICK OB
Glucose, UA: NEGATIVE
POC,PROTEIN,UA: NEGATIVE

## 2019-08-08 LAB — FETAL NONSTRESS TEST

## 2019-08-08 NOTE — Progress Notes (Signed)
Routine Prenatal Care Visit  Subjective  Dorothy Townsend is a 29 y.o. G2P0101 at [redacted]w[redacted]d being seen today for ongoing prenatal care.  She is currently monitored for the following issues for this high-risk pregnancy and has GAD (generalized anxiety disorder); Irritable bowel syndrome with constipation; Screening cholesterol level; Other fatigue; Constipation; Supervision of high risk pregnancy, antepartum; History of preterm delivery, currently pregnant; Constipation during pregnancy; Placenta previa antepartum; Vaginal bleeding during pregnancy; Vaginal bleeding in pregnancy, third trimester; [redacted] weeks gestation of pregnancy; and Placental abruption in third trimester on their problem list.  ----------------------------------------------------------------------------------- Patient reports no complaints.   Contractions: Irregular. Vag. Bleeding: None.  Movement: Present. Denies leaking of fluid.  ----------------------------------------------------------------------------------- The following portions of the patient's history were reviewed and updated as appropriate: allergies, current medications, past family history, past medical history, past social history, past surgical history and problem list. Problem list updated.   Objective  Blood pressure (!) 104/60, weight 154 lb (69.9 kg), last menstrual period 12/09/2018. Pregravid weight 131 lb (59.4 kg) Total Weight Gain 23 lb (10.4 kg) Urinalysis:      Fetal Status: Fetal Heart Rate (bpm): 150   Movement: Present  Presentation: Vertex  General:  Alert, oriented and cooperative. Patient is in no acute distress.  Skin: Skin is warm and dry. No rash noted.   Cardiovascular: Normal heart rate noted  Respiratory: Normal respiratory effort, no problems with respiration noted  Abdomen: Soft, gravid, appropriate for gestational age. Pain/Pressure: Absent     Pelvic:  Cervical exam deferred        Extremities: Normal range of motion.     ental  Status: Normal mood and affect. Normal behavior. Normal judgment and thought content.   Baseline: 150 Variability: moderate Accelerations: present Decelerations: absent Tocometry: N/A The patient was monitored for 30 minutes, fetal heart rate tracing was deemed reactive, category I tracing,  CPT [id number]   Assessment   28 y.o. G2P0101 at [redacted]w[redacted]d by  09/15/2019, by Last Menstrual Period presenting for routine prenatal visit  Plan   pregnancy Problems (from 01/28/19 to present)    Problem Noted Resolved   Vaginal bleeding during pregnancy 06/10/2019 by Conard Novak, MD No   Placenta previa antepartum 04/20/2019 by Conard Novak, MD No   Overview Signed 06/10/2019  9:15 AM by Conard Novak, MD    Resolved at 26 weeks      Constipation during pregnancy 02/14/2019 by Conard Novak, MD No   Constipation 01/28/2019 by Conard Novak, MD No   Supervision of high risk pregnancy, antepartum 01/28/2019 by Conard Novak, MD No   Overview Addendum 07/28/2019 12:27 PM by Vena Austria, MD    [facility] Prenatal Labs  Dating L=7 Blood type: B/Positive/-- (01/15 1255)   Genetic Screen 1 Screen:    AFP:     Quad:     NIPS: Antibody:Negative (01/15 1255)  Anatomic [place] complete Rubella: 9.10 (01/15 1255)  Varicella: Immune  GTT Third trimester: 129 RPR: Non Reactive (01/15 1255)   Rhogam n/a HBsAg: Negative (01/15 1255)   TDaP vaccine 07/13/2019  Flu Shot: HIV: Non Reactive (01/15 1255)   Baby Food                                GBS:   Contraception  Pap:  CBB     CS/VBAC    Support Person [place], [address]  Previous Version   History of preterm delivery, currently pregnant 01/28/2019 by Conard Novak, MD No   Overview Addendum 06/20/2019 11:31 AM by Conard Novak, MD    [x ] progesterone vaginal suppositories [x]  16 week cervical length u/s      Previous Version       Gestational age appropriate obstetric precautions including but not limited  to vaginal bleeding, contractions, leaking of fluid and fetal movement were reviewed in detail with the patient.    Return in about 3 days (around 08/11/2019) for ROB, NST, AFI.  08/13/2019, MD, Vena Austria [facility], [facility] 08/08/2019, 11:24 AM

## 2019-08-08 NOTE — Progress Notes (Signed)
ROB/NST- no concerns 

## 2019-08-11 ENCOUNTER — Other Ambulatory Visit: Payer: Self-pay

## 2019-08-11 ENCOUNTER — Inpatient Hospital Stay
Admission: EM | Admit: 2019-08-11 | Discharge: 2019-08-12 | DRG: 833 | Disposition: A | Discharge status: Home / Self Care | Payer: 59 | Attending: Obstetrics & Gynecology | Admitting: Obstetrics & Gynecology

## 2019-08-11 ENCOUNTER — Encounter: Payer: Self-pay | Admitting: Obstetrics & Gynecology

## 2019-08-11 ENCOUNTER — Ambulatory Visit (INDEPENDENT_AMBULATORY_CARE_PROVIDER_SITE_OTHER): Payer: 59

## 2019-08-11 ENCOUNTER — Ambulatory Visit (INDEPENDENT_AMBULATORY_CARE_PROVIDER_SITE_OTHER): Payer: 59 | Admitting: Obstetrics and Gynecology

## 2019-08-11 VITALS — BP 143/87 | Wt 155.0 lb

## 2019-08-11 DIAGNOSIS — O4690 Antepartum hemorrhage, unspecified, unspecified trimester: Secondary | ICD-10-CM

## 2019-08-11 DIAGNOSIS — O09213 Supervision of pregnancy with history of pre-term labor, third trimester: Secondary | ICD-10-CM | POA: Diagnosis not present

## 2019-08-11 DIAGNOSIS — K5909 Other constipation: Secondary | ICD-10-CM

## 2019-08-11 DIAGNOSIS — O0990 Supervision of high risk pregnancy, unspecified, unspecified trimester: Secondary | ICD-10-CM

## 2019-08-11 DIAGNOSIS — R030 Elevated blood-pressure reading, without diagnosis of hypertension: Secondary | ICD-10-CM | POA: Diagnosis present

## 2019-08-11 DIAGNOSIS — O4593 Premature separation of placenta, unspecified, third trimester: Secondary | ICD-10-CM | POA: Diagnosis present

## 2019-08-11 DIAGNOSIS — Z20822 Contact with and (suspected) exposure to covid-19: Secondary | ICD-10-CM | POA: Diagnosis present

## 2019-08-11 DIAGNOSIS — O0993 Supervision of high risk pregnancy, unspecified, third trimester: Secondary | ICD-10-CM | POA: Diagnosis not present

## 2019-08-11 DIAGNOSIS — Z3A35 35 weeks gestation of pregnancy: Secondary | ICD-10-CM | POA: Diagnosis not present

## 2019-08-11 DIAGNOSIS — O133 Gestational [pregnancy-induced] hypertension without significant proteinuria, third trimester: Secondary | ICD-10-CM | POA: Diagnosis present

## 2019-08-11 DIAGNOSIS — O4693 Antepartum hemorrhage, unspecified, third trimester: Secondary | ICD-10-CM

## 2019-08-11 DIAGNOSIS — O4590 Premature separation of placenta, unspecified, unspecified trimester: Secondary | ICD-10-CM | POA: Diagnosis present

## 2019-08-11 DIAGNOSIS — K5900 Constipation, unspecified: Secondary | ICD-10-CM

## 2019-08-11 DIAGNOSIS — O09899 Supervision of other high risk pregnancies, unspecified trimester: Secondary | ICD-10-CM

## 2019-08-11 DIAGNOSIS — O4400 Placenta previa specified as without hemorrhage, unspecified trimester: Secondary | ICD-10-CM

## 2019-08-11 HISTORY — DX: Gestational (pregnancy-induced) hypertension without significant proteinuria, third trimester: O13.3

## 2019-08-11 LAB — CBC
HCT: 35.5 % — ABNORMAL LOW (ref 36.0–46.0)
Hemoglobin: 12.1 g/dL (ref 12.0–15.0)
MCH: 30.7 pg (ref 26.0–34.0)
MCHC: 34.1 g/dL (ref 30.0–36.0)
MCV: 90.1 fL (ref 80.0–100.0)
Platelets: 209 10*3/uL (ref 150–400)
RBC: 3.94 MIL/uL (ref 3.87–5.11)
RDW: 12.1 % (ref 11.5–15.5)
WBC: 10 10*3/uL (ref 4.0–10.5)
nRBC: 0 % (ref 0.0–0.2)

## 2019-08-11 LAB — TYPE AND SCREEN
ABO/RH(D): B POS
Antibody Screen: NEGATIVE

## 2019-08-11 LAB — COMPREHENSIVE METABOLIC PANEL
ALT: 14 U/L (ref 0–44)
AST: 19 U/L (ref 15–41)
Albumin: 3.4 g/dL — ABNORMAL LOW (ref 3.5–5.0)
Alkaline Phosphatase: 107 U/L (ref 38–126)
Anion gap: 11 (ref 5–15)
BUN: 9 mg/dL (ref 6–20)
CO2: 21 mmol/L — ABNORMAL LOW (ref 22–32)
Calcium: 9.2 mg/dL (ref 8.9–10.3)
Chloride: 103 mmol/L (ref 98–111)
Creatinine, Ser: <0.3 mg/dL — ABNORMAL LOW (ref 0.44–1.00)
Glucose, Bld: 82 mg/dL (ref 70–99)
Potassium: 3.8 mmol/L (ref 3.5–5.1)
Sodium: 135 mmol/L (ref 135–145)
Total Bilirubin: 0.6 mg/dL (ref 0.3–1.2)
Total Protein: 6.6 g/dL (ref 6.5–8.1)

## 2019-08-11 LAB — RESPIRATORY PANEL BY RT PCR (FLU A&B, COVID)
Influenza A by PCR: NEGATIVE
Influenza B by PCR: NEGATIVE
SARS Coronavirus 2 by RT PCR: NEGATIVE

## 2019-08-11 LAB — PROTEIN / CREATININE RATIO, URINE
Creatinine, Urine: 44 mg/dL
Total Protein, Urine: <6 mg/dL

## 2019-08-11 MED ORDER — LACTATED RINGERS IV BOLUS
500.0000 mL | Freq: Once | INTRAVENOUS | Status: AC
  Administered 2019-08-11: 500 mL via INTRAVENOUS

## 2019-08-11 MED ORDER — OXYTOCIN-SODIUM CHLORIDE 30-0.9 UT/500ML-% IV SOLN: 2.5000 [IU]/h | INTRAVENOUS | Status: DC

## 2019-08-11 MED ORDER — LACTATED RINGERS IV SOLN: INTRAVENOUS | Status: DC

## 2019-08-11 MED ORDER — LIDOCAINE HCL (PF) 1 % IJ SOLN: 30.0000 mL | INTRAMUSCULAR | Status: DC | PRN

## 2019-08-11 MED ORDER — SODIUM CHLORIDE 0.9 % IV SOLN
5.0000 10*6.[IU] | Freq: Once | INTRAVENOUS | Status: AC
  Administered 2019-08-11: 5 10*6.[IU] via INTRAVENOUS
  Filled 2019-08-11: qty 5

## 2019-08-11 MED ORDER — LACTATED RINGERS IV SOLN: 500.0000 mL | INTRAVENOUS | Status: DC | PRN

## 2019-08-11 MED ORDER — OXYTOCIN BOLUS FROM INFUSION: 333.0000 mL | Freq: Once | INTRAVENOUS | Status: DC

## 2019-08-11 MED ORDER — ACETAMINOPHEN 325 MG PO TABS: 650.0000 mg | ORAL_TABLET | ORAL | Status: DC | PRN

## 2019-08-11 MED ORDER — PENICILLIN G POT IN DEXTROSE 60000 UNIT/ML IV SOLN
3.0000 10*6.[IU] | INTRAVENOUS | Status: DC
  Administered 2019-08-12 (×2): 3 10*6.[IU] via INTRAVENOUS
  Filled 2019-08-11 (×2): qty 50

## 2019-08-11 MED ORDER — BUTORPHANOL TARTRATE 1 MG/ML IJ SOLN: 1.0000 mg | INTRAMUSCULAR | Status: DC | PRN

## 2019-08-11 MED ORDER — ONDANSETRON HCL 4 MG/2ML IJ SOLN: 4.0000 mg | Freq: Four times a day (QID) | INTRAMUSCULAR | Status: DC | PRN

## 2019-08-11 NOTE — H&P (Signed)
Obstetrics Admission History & Physical   CC: elevated blood pressures in office  HPI:  29 y.o. G2P0101 @ [redacted]w[redacted]d (09/15/2019, by Last Menstrual Period). Admitted on 08/11/2019:   Patient Active Problem List   Diagnosis Date Noted  . Antepartum placental abruption 08/11/2019  . Placental abruption in third trimester 07/29/2019  . Vaginal bleeding in pregnancy, third trimester 07/05/2019  . Vaginal bleeding during pregnancy 06/10/2019  . Placenta previa antepartum 04/20/2019  . Constipation during pregnancy 02/14/2019  . Constipation 01/28/2019  . Supervision of high risk pregnancy, antepartum 01/28/2019  . History of preterm delivery, currently pregnant 01/28/2019  . GAD (generalized anxiety disorder) 07/27/2014  . Irritable bowel syndrome with constipation 07/27/2014  . Screening cholesterol level 07/27/2014  . Other fatigue 07/27/2014     Presents for evaluation of blood pressure el;evation as noted in office today.  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 She does report some contraction pain since last night, intermittent and mild.  No VB or ROM.  No recent bleeding reported.  Denies headache, blurry vision, CP, SOB, epigastric pain, edema.  Prenatal care at: at [facility]. Pregnancy complicated by placental abruption.  ROS: A review of systems was performed and negative, except as stated in the above HPI. PMHx:  Past Medical History:  Diagnosis Date  . Acne   . ADD (attention deficit disorder)   . ADHD (attention deficit hyperactivity disorder)    history of  . Anxiety    history of  . Constipation   . Immunization, viral disease    GARDASIL COMPLETED  . Personal history of kidney stones    PSHx:  Past Surgical History:  Procedure Laterality Date  . EXCISION OF KELOID    . EXTERNAL EAR SURGERY Bilateral 2012   NODULES REMOVED   . TONSILLECTOMY  2015  . WISDOM TOOTH EXTRACTION     Medications:   Medications Prior to Admission  Medication Sig Dispense Refill Last Dose  . Prenatal Vit-Fe Fumarate-FA (PRENATAL VITAMIN PO) Take by mouth.   08/11/2019 at Unknown time   Allergies: is allergic to fluvoxamine. OBHx:  OB History  Gravida Para Term Preterm AB Living  2 1 0 1 0 1  SAB TAB Ectopic Multiple Live Births  0 0 0 0 1    # Outcome Date GA Lbr Len/2nd Weight Sex Delivery Anes PTL Lv  2 Current           1 Preterm 04/14/17 [redacted]w[redacted]d 11:22 / 00:58 2500 g M Vag-Spont EPI  LIV   NLZ:JQBHALPF/[id number] except as detailed in HPI.Marland Kitchen  No family history of birth defects. Soc Hx: Alcohol: none and Recreational drug use: none  Objective:   Vitals:   08/11/19 1742 08/11/19 1925  BP: 128/73 (!) 134/75  Pulse: 101 99  Resp:  18  Temp:  98.5 F (36.9 C)   Constitutional: Well nourished, well developed female in no acute distress.  HEENT: normal Skin: Warm and dry.  Cardiovascular:Regular rate and rhythm.   Extremity: trace to 1+ bilateral pedal edema Respiratory: Clear to auscultation bilateral. Normal respiratory effort Abdomen: gravid, ND, FHT present, mild tenderness on exam Back: no CVAT Neuro: DTRs 2+, Cranial nerves grossly intact Psych: Alert and Oriented x3. No memory deficits. Normal mood and affect.  MS: normal gait, normal bilateral lower extremity ROM/strength/stability.  Pelvic exam: is not limited by body habitus EGBUS: within normal limits Vagina: within normal limits and with normal mucosa Cervix: CERVIX:  3 cm dilated, 75 effaced, -3 station, VTX Uterus: Spontaneous uterine activity  Adnexa: not evaluated  EFM:FHR: 150 bpm, variability: moderate,  accelerations:  Present,  decelerations:  Absent Toco: Frequency: Every 2-4 minutes   Perinatal info:  Blood type: B positive Rubella- Immune Varicella -Immune TDaP tetanus 07/13/2019 RPR NR / HIV Neg/ HBsAg Neg   Assessment & Plan:   29 y.o. G2P0101 @ [redacted]w[redacted]d, Admitted on 08/11/2019:  1. Evaluation for  preclampsia or PIH.  Labs are normal and BP have normalized on bed rest here. Will cont to monitor.  Risks of elevated BP on pregnancy, and on risk for abruption complications, counseled. 2. Evaluation for PTL.  Cervix has changed to 3 cm (was 1 cm 2 weeks ago).  Ctx's are reg on monitor, mild to patient.  Recommend with her history and risk factor that we observe more conservatively and closely in the hospital tonight for any s/sx PTL.. If so, then allow labor for vaginal delivery.  ABX for GBS.  Montior for any s/sx abruption.  Has received prior BMZ. 3. Fluids and diet ok at this time.  If labors, will move into routine protocols for labor.  Annamarie Major, MD, [facility], [facility] 08/11/2019  7:31 PM

## 2019-08-11 NOTE — Progress Notes (Signed)
Routine Prenatal Care Visit  Subjective  Dorothy Townsend is a 29 y.o. G2P0101 at [redacted]w[redacted]d being seen today for ongoing prenatal care.  She is currently monitored for the following issues for this high-risk pregnancy and has GAD (generalized anxiety disorder); Irritable bowel syndrome with constipation; Screening cholesterol level; Other fatigue; Constipation; Supervision of high risk pregnancy, antepartum; History of preterm delivery, currently pregnant; Constipation during pregnancy; Placenta previa antepartum; Vaginal bleeding during pregnancy; Vaginal bleeding in pregnancy, third trimester; [redacted] weeks gestation of pregnancy; and Placental abruption in third trimester on their problem list.  ----------------------------------------------------------------------------------- Patient reports no complaints.  No epdisodes of vaginal bleeding, denies HA or vision changes.  Weight stable Contractions: Irregular.  .  Movement: Present. Denies leaking of fluid.  ----------------------------------------------------------------------------------- The following portions of the patient's history were reviewed and updated as appropriate: allergies, current medications, past family history, past medical history, past social history, past surgical history and problem list. Problem list updated.   Objective  Blood pressure (!) 143/87, weight 155 lb (70.3 kg), last menstrual period 12/09/2018. Pregravid weight 131 lb (59.4 kg) Total Weight Gain 24 lb (10.9 kg) Urinalysis:      Fetal Status: Fetal Heart Rate (bpm): 145   Movement: Present  Presentation: Vertex  General:  Alert, oriented and cooperative. Patient is in no acute distress.  Skin: Skin is warm and dry. No rash noted.   Cardiovascular: Normal heart rate noted  Respiratory: Normal respiratory effort, no problems with respiration noted  Abdomen: Soft, gravid, appropriate for gestational age. Pain/Pressure: Absent     Pelvic:  Cervical exam  deferred        Extremities: Normal range of motion.     ental Status: Normal mood and affect. Normal behavior. Normal judgment and thought content.   Baseline: 145 Variability: moderate Accelerations: present Decelerations: absent Tocometry: N/A The patient was monitored for 30 minutes, fetal heart rate tracing was deemed reactive, category I tracing,  CPT 59025   US OB Follow Up  Result Date: 08/11/2019 Patient Name: Dorothy Townsend DOB: 1990-11-25 MRN: [id number] ULTRASOUND REPORT Location: [facility] Date of Service: 08/11/2019 Indications:AFI Findings: Mason Jim intrauterine pregnancy is visualized with FHR at 136 BPM. Fetal presentation is Cephalic. Placenta: posterior. Grade: 1 AFI: 19.5 cm Impression: 1. [redacted]w[redacted]d Viable Singleton Intrauterine pregnancy dated by previously established criteria. 2. AFI is 19.5 cm. Recommendations: 1.Clinical correlation with the patient's History and Physical Exam. Deanna Artis, RT There is a singleton gestation with normal amniotic fluid volume. The visualized fetal anatomy appears within normal limits within the resolution of ultrasound as described above.  It must be noted that a normal ultrasound is unable to rule out fetal aneuploidy.  Vena Austria, MD, [facility], [facility] 08/11/2019, 2:35 PM   [place] MFM MCA DOPPLER  Result Date: 07/29/2019 ----------------------------------------------------------------------  OBSTETRICS REPORT                       (Signed Final 07/29/2019 05:18 pm) ---------------------------------------------------------------------- Patient Info  ID #:       [id number]                          D.O.B.:  Mar 17, 1990 (28 yrs)  Name:       Dorothy Townsend               Visit Date: 07/29/2019 04:10 pm ---------------------------------------------------------------------- Performed By  Attending:  Corenthian Booker      Ref. Address:     [address]                    MD                                                              [place], [place],                                                             [place] [address]  Performed By:     Neita Carp             Location:         [facility]                                                             [facility] [place]                                                             [place]  Referred By:      Conard Novak MD ---------------------------------------------------------------------- Orders  #  Description                           Code        Ordered By  1  [place] MFM MCA DOPPLER                    [phone]    [name]  2  [place] MFM OB COMP + 14 WK                76805.01    [name] ----------------------------------------------------------------------  #  Order #                     Accession #                Episode #  1  [id number]                   [id number]                 [id number]  2  [id number]                   [id number]                 [id number] ---------------------------------------------------------------------- Indications  [redacted] weeks gestation of pregnancy                Z3A.33  Placental abruption                            O45.90 ---------------------------------------------------------------------- Fetal Evaluation  Num Of Fetuses:         1  Fetal Heart Rate(bpm):  137  Cardiac Activity:       Observed  Presentation:           Cephalic  Placenta:               Posterior  P. Cord Insertion:      Visualized, central  AFI Sum(cm)     %Tile       Largest Pocket(cm)  18.4            68          5.7  RUQ(cm)       RLQ(cm)       LUQ(cm)        LLQ(cm)  5.7           3.8           4.4            4.5 ---------------------------------------------------------------------- Biometry  BPD:      85.8  mm     G. Age:  34w 4d         83  %    CI:        77.29   %    70 - 86                                                          FL/HC:      20.3    %    19.9 - 21.5  HC:       309   mm     G. Age:  34w 3d         48  %    HC/AC:      1.07        0.96 - 1.11  AC:      289.3  mm     G. Age:  33w 0d         45  %    FL/BPD:     73.0   %    71 - 87  FL:       62.6  mm     G. Age:  32w 3d         20  %    FL/AC:      21.6   %    20 - 24  HUM:     54.03  mm     G. Age:  31w 3d         25  %  Est. FW:    2116  gm    4 lb 11 oz      39  % ---------------------------------------------------------------------- Gestational Age  LMP:           33w 1d        Date:  12/09/18                 EDD:   09/15/19  U/S Today:     33w 4d                                        EDD:   09/12/19  Best:          33w 1d     Det. By:  LMP  (12/09/18)          EDD:   09/15/19 ---------------------------------------------------------------------- Anatomy  Cranium:               Appears normal         Aortic Arch:            Appears normal  Cavum:                 Appears normal         Ductal Arch:            Appears normal  Ventricles:            Appears normal         Diaphragm:              Appears normal  Choroid Plexus:        Appears normal         Stomach:                Appears normal, left                                                                        sided  Cerebellum:            Appears normal         Abdomen:                Appears normal  Posterior Fossa:       Not well visualized    Abdominal Wall:         Appears nml (cord                                                                        insert, abd wall)  Nuchal Fold:           Not applicable (>20    Cord Vessels:           Appears normal ([redacted]                         wks GA)  vessel cord)  Face:                  Appears normal         Kidneys:                Appear normal                         (orbits and profile)  Lips:                  Appears normal         Bladder:                Appears normal  Thoracic:              Appears normal         Spine:                  Not well  visualized  Heart:                 Appears normal         Upper Extremities:      Appears normal                         (4CH, axis, and                         situs)  RVOT:                  Appears normal         Lower Extremities:      Appears normal  LVOT:                  Appears normal  Other:  Spine poorly seen due to fetal position. ---------------------------------------------------------------------- Doppler - Fetal Vessels  Middle Cerebral Artery                                       PI    %tile     PSV   MoM                                                     (cm/s)                                     1.66        9     51.6  1.11 ---------------------------------------------------------------------- Impression  Ms. Pryer is seen in the hospital for vaginal bleeding and  uterine contraction with clinically suspected placental  abruption and fetal maternal hemorrhage with a KB of 5 and  now .5.  Her last growth was appropriate as of April 2021.  Normal interval growth was observed with an EFW at the  39%.  I discussed today's findings and management with Dr.  Jean Rosenthal. KB has overall low clincal predictive power as  compared to MCA dopplers as it relates to predicing anemia.  The MCA dopplers were performed with a MOM of < 1 to 1.1  with the PSV.  They were all less the 1.5 MoM. The placenta  appears intact with no evidence of placental abruption ,  however, placental abruption is a clinical diagnosis. ---------------------------------------------------------------------- Recommendations  Continue inpatient management as Ms. Gohlke is continuing  to contract.  Consider discharge 48 hour after her last  contraction/bleeding.  Complete course of steroids. ----------------------------------------------------------------------               Lin Landsman, MD Electronically Signed Final Report   07/29/2019 05:18 pm ----------------------------------------------------------------------  [place] MFM OB COMP + 14  WK  Result Date: 07/29/2019 ----------------------------------------------------------------------  OBSTETRICS REPORT                       (Signed Final 07/29/2019 05:18 pm) ---------------------------------------------------------------------- Patient Info  ID #:       [id number]                          D.O.B.:  May 15, 1990 (28 yrs)  Name:       Dorothy Townsend               Visit Date: 07/29/2019 04:10 pm ---------------------------------------------------------------------- Performed By  Attending:        Lin Landsman      Ref. Address:     [address]                    [place]                                                             [place], [place],                                                             [place] [address]  Performed By:     Neita Carp             [facility]         [facility]                                                             at [place]                                                             [place]  Referred By:      Jeannett Senior  Leo Rod MD ---------------------------------------------------------------------- Orders  #  Description                           Code        Ordered By  1  [place] MFM MCA DOPPLER                    [phone]    [name]  2  [place] MFM OB COMP + 14 WK                [id number]    [name] ----------------------------------------------------------------------  #  Order #                     Accession #                Episode #  1  [id number]                   [id number]                 [id number]  2  [id number]                   [id number]                 [id number] ---------------------------------------------------------------------- Indications  [redacted] weeks gestation of pregnancy                Z3A.33  Placental abruption                            O45.90 ---------------------------------------------------------------------- Fetal Evaluation  Num Of  Fetuses:         1  Fetal Heart Rate(bpm):  137  Cardiac Activity:       Observed  Presentation:           Cephalic  Placenta:               Posterior  P. Cord Insertion:      Visualized, central  AFI Sum(cm)     %Tile       Largest Pocket(cm)  18.4            68          5.7  RUQ(cm)       RLQ(cm)       LUQ(cm)        LLQ(cm)  5.7           3.8           4.4            4.5 ---------------------------------------------------------------------- Biometry  BPD:      85.8  mm     G. Age:  34w 4d         83  %    CI:        77.29   %    70 - 86                                                          FL/HC:      20.3   %  19.9 - 21.5  HC:       309   mm     G. Age:  34w 3d         48  %    HC/AC:      1.07        0.96 - 1.11  AC:      289.3  mm     G. Age:  33w 0d         45  %    FL/BPD:     73.0   %    71 - 87  FL:       62.6  mm     G. Age:  32w 3d         20  %    FL/AC:      21.6   %    20 - 24  HUM:     54.03  mm     G. Age:  31w 3d         25  %  Est. FW:    2116  gm    4 lb 11 oz      39  % ---------------------------------------------------------------------- Gestational Age  LMP:           33w 1d        Date:  12/09/18                 EDD:   09/15/19  U/S Today:     33w 4d                                        EDD:   09/12/19  Best:          33w 1d     Det. By:  LMP  (12/09/18)          EDD:   09/15/19 ---------------------------------------------------------------------- Anatomy  Cranium:               Appears normal         Aortic Arch:            Appears normal  Cavum:                 Appears normal         Ductal Arch:            Appears normal  Ventricles:            Appears normal         Diaphragm:              Appears normal  Choroid Plexus:        Appears normal         Stomach:                Appears normal, left                                                                        sided  Cerebellum:            Appears normal  Abdomen:                Appears normal  Posterior Fossa:        Not well visualized    Abdominal Wall:         Appears nml (cord                                                                        insert, abd wall)  Nuchal Fold:           Not applicable (>20    Cord Vessels:           Appears normal ([redacted]                         wks GA)                                        vessel cord)  Face:                  Appears normal         Kidneys:                Appear normal                         (orbits and profile)  Lips:                  Appears normal         Bladder:                Appears normal  Thoracic:              Appears normal         Spine:                  Not well visualized  Heart:                 Appears normal         Upper Extremities:      Appears normal                         (4CH, axis, and                         situs)  RVOT:                  Appears normal         Lower Extremities:      Appears normal  LVOT:                  Appears normal  Other:  Spine poorly seen due to fetal position. ---------------------------------------------------------------------- Doppler - Fetal Vessels  Middle Cerebral Artery                                       PI    %tile  PSV   MoM                                                     (cm/s)                                     1.66        9     51.6  1.11 ---------------------------------------------------------------------- Impression  Ms. Batta is seen in the hospital for vaginal bleeding and  uterine contraction with clinically suspected placental  abruption and fetal maternal hemorrhage with a KB of 5 and  now .5.  Her last growth was appropriate as of April 2021.  Normal interval growth was observed with an EFW at the  39%.  I discussed today's findings and management with Dr.  Jean Rosenthal. KB has overall low clincal predictive power as  compared to MCA dopplers as it relates to predicing anemia.  The MCA dopplers were performed with a MOM of < 1 to 1.1  with the PSV. They were all less the 1.5 MoM. The placenta   appears intact with no evidence of placental abruption ,  however, placental abruption is a clinical diagnosis. ---------------------------------------------------------------------- Recommendations  Continue inpatient management as Ms. Snipe is continuing  to contract.  Consider discharge 48 hour after her last  contraction/bleeding.  Complete course of steroids. ----------------------------------------------------------------------               Lin Landsman, MD Electronically Signed Final Report   07/29/2019 05:18 pm ----------------------------------------------------------------------    Assessment   28 y.o. G2P0101 at [redacted]w[redacted]d by  09/15/2019, by Last Menstrual Period presenting for routine prenatal visit  Plan   pregnancy Problems (from 01/28/19 to present)    Problem Noted Resolved   Vaginal bleeding during pregnancy 06/10/2019 by Conard Novak, MD No   Placenta previa antepartum 04/20/2019 by Conard Novak, MD No   Overview Signed 06/10/2019  9:15 AM by Conard Novak, MD    Resolved at 26 weeks      Constipation during pregnancy 02/14/2019 by Conard Novak, MD No   Constipation 01/28/2019 by Conard Novak, MD No   Supervision of high risk pregnancy, antepartum 01/28/2019 by Conard Novak, MD No   Overview Addendum 07/28/2019 12:27 PM by Vena Austria, MD    Clinic [facility] Prenatal Labs  Dating L=7 Blood type: B/Positive/-- (01/15 1255)   Genetic Screen 1 Screen:    AFP:     Quad:     NIPS: Antibody:Negative (01/15 1255)  Anatomic [place] complete Rubella: 9.10 (01/15 1255)  Varicella: Immune  GTT Third trimester: 129 RPR: Non Reactive (01/15 1255)   Rhogam n/a HBsAg: Negative (01/15 1255)   TDaP vaccine 07/13/2019  Flu Shot: HIV: Non Reactive (01/15 1255)   Baby Food                                GBS:   Contraception  Pap:  CBB     CS/VBAC    Support Person Azzie Glatter           Previous Version   History of preterm delivery, currently  pregnant 01/28/2019 by Conard Novak, MD No  Overview Addendum 06/20/2019 11:31 AM by Conard Novak, MD    [x ] progesterone vaginal suppositories  16 week cervical length u/s      Previous Version       Gestational age appropriate obstetric precautions including but not limited to vaginal bleeding, contractions, leaking of fluid and fetal movement were reviewed in detail with the patient.    1) Chronic abruption - stable, reactive NST, normal AFI, and no further bleeding since hospitalization.  Has completed BMZ course  2) Elevated BP - to L&D for preeclampsia work up.  If BP consistently elevated we did discuss the risk of abruption in the setting of elevated BP and this would probably be a consideration in moving towards delivery.    Return in about 4 days (around 08/15/2019) for ROB, NST.  Vena Austria, MD, [facility], [facility] 08/11/2019, 3:18 PM

## 2019-08-11 NOTE — Progress Notes (Signed)
ROB NST 

## 2019-08-12 ENCOUNTER — Encounter: Payer: Self-pay | Admitting: Obstetrics & Gynecology

## 2019-08-12 DIAGNOSIS — O133 Gestational [pregnancy-induced] hypertension without significant proteinuria, third trimester: Secondary | ICD-10-CM

## 2019-08-12 NOTE — Progress Notes (Signed)
Discharge instructions reviewed with patient by MD and RN. Patient verbalized understanding and all questions answered. RN discharged patient via wheelchair with support person.

## 2019-08-12 NOTE — Discharge Summary (Signed)
Physician Discharge Summary  Patient ID: Dorothy Townsend MRN: [id number] DOB/AGE: Jun 09, 1990 29 y.o.  Admit date: 08/11/2019 Discharge date: 08/12/2019  Admission Diagnoses: Active Problems:   Antepartum placental abruption   Pregnancy-induced hypertension in third trimester   35 weeks pregnancy  Discharge Diagnoses:  Active Problems:   Antepartum placental abruption   Pregnancy-induced hypertension in third trimester   Discharged Condition: good  Hospital Course: Pt seen and examined, for elevated blood pressures noted in office, and also for contractions with cervical change at 35 weeks, and with known prior abruption.  No bleeding noted while under observation, reassuring fetal monitoring, good fetal movement, improvement in blood pressures w rest, and no further cervical change after initial exam of 3 cm.  No s/sx preeclampsia.  Plan outpatient monitoring and modified bed rest.  Appointment 3 days. Return for s/sx PIH or s/sx PTL or bleeding/pain.  Consults: None  Significant Diagnostic Studies: A NST procedure was performed with FHR monitoring and a normal baseline established, appropriate time of 20-40 minutes of evaluation, and accels >2 seen w 15x15 characteristics.  Results show a REACTIVE NST.   Labs reviewed  Treatments: IV hydration and antibiotics: for GBS in case labored precipitously  Discharge Exam: Blood pressure (!) 113/63, pulse 91, temperature 98.1 F (36.7 C), temperature source Oral, resp. rate 16, height 5\' 1"  (1.549 m), weight 70.3 kg, last menstrual period 12/09/2018. General appearance: alert, cooperative and no distress Head: Normocephalic, without obvious abnormality, atraumatic Resp: clear to auscultation bilaterally Cardio: regular rate and rhythm, S1, S2 normal, no murmur, click, rub or gallop GI: gravid, ND, NT, FHT 140s Pelvic: external genitalia normal and Cervix 3,75,high Skin: Skin color, texture, turgor normal. No rashes or  lesions Neurologic: Grossly normal  Disposition: Discharge disposition: 01-Home or Self Care       Discharge Instructions    Call MD for:   Complete by: As directed    Worsening contractions or pain; leakage of fluid; bleeding.   Diet - low sodium heart healthy   Complete by: As directed    Increase activity slowly   Complete by: As directed      Allergies as of 08/12/2019      Reactions   Fluvoxamine Anxiety      Medication List    TAKE these medications   PRENATAL VITAMIN PO Take by mouth.       Follow-up Information    08/14/2019, MD. Go in 3 day(s).   Specialty: Obstetrics and Gynecology Contact information: [address] [place] [phone]               Signed: [phone] 08/12/2019, 7:45 AM

## 2019-08-12 NOTE — Discharge Instructions (Signed)
Braxton Hicks Contractions °Contractions of the uterus can occur throughout pregnancy, but they are not always a sign that you are in labor. You may have practice contractions called Braxton Hicks contractions. These false labor contractions are sometimes confused with true labor. °What are Braxton Hicks contractions? °Braxton Hicks contractions are tightening movements that occur in the muscles of the uterus before labor. Unlike true labor contractions, these contractions do not result in opening (dilation) and thinning of the cervix. Toward the end of pregnancy (32-34 weeks), Braxton Hicks contractions can happen more often and may become stronger. These contractions are sometimes difficult to tell apart from true labor because they can be very uncomfortable. You should not feel embarrassed if you go to the hospital with false labor. °Sometimes, the only way to tell if you are in true labor is for your health care provider to look for changes in the cervix. The health care provider will do a physical exam and may monitor your contractions. If you are not in true labor, the exam should show that your cervix is not dilating and your water has not broken. °If there are no other health problems associated with your pregnancy, it is completely safe for you to be sent home with false labor. You may continue to have Braxton Hicks contractions until you go into true labor. °How to tell the difference between true labor and false labor °True labor °· Contractions last 30-70 seconds. °· Contractions become very regular. °· Discomfort is usually felt in the top of the uterus, and it spreads to the lower abdomen and low back. °· Contractions do not go away with walking. °· Contractions usually become more intense and increase in frequency. °· The cervix dilates and gets thinner. °False labor °· Contractions are usually shorter and not as strong as true labor contractions. °· Contractions are usually irregular. °· Contractions  are often felt in the front of the lower abdomen and in the groin. °· Contractions may go away when you walk around or change positions while lying down. °· Contractions get weaker and are shorter-lasting as time goes on. °· The cervix usually does not dilate or become thin. °Follow these instructions at home: ° °· Take over-the-counter and prescription medicines only as told by your health care provider. °· Keep up with your usual exercises and follow other instructions from your health care provider. °· Eat and drink lightly if you think you are going into labor. °· If Braxton Hicks contractions are making you uncomfortable: °? Change your position from lying down or resting to walking, or change from walking to resting. °? Sit and rest in a tub of warm water. °? Drink enough fluid to keep your urine pale yellow. Dehydration may cause these contractions. °? Do slow and deep breathing several times an hour. °· Keep all follow-up prenatal visits as told by your health care provider. This is important. °Contact a health care provider if: °· You have a fever. °· You have continuous pain in your abdomen. °Get help right away if: °· Your contractions become stronger, more regular, and closer together. °· You have fluid leaking or gushing from your vagina. °· You pass blood-tinged mucus (bloody show). °· You have bleeding from your vagina. °· You have low back pain that you never had before. °· You feel your baby’s head pushing down and causing pelvic pressure. °· Your baby is not moving inside you as much as it used to. °Summary °· Contractions that occur before labor are   called Braxton Hicks contractions, false labor, or practice contractions. °· Braxton Hicks contractions are usually shorter, weaker, farther apart, and less regular than true labor contractions. True labor contractions usually become progressively stronger and regular, and they become more frequent. °· Manage discomfort from Braxton Hicks contractions  by changing position, resting in a warm bath, drinking plenty of water, or practicing deep breathing. °This information is not intended to replace advice given to you by your health care provider. Make sure you discuss any questions you have with your health care provider. °Document Revised: 12/12/2016 Document Reviewed: 05/15/2016 °Elsevier Patient Education © 2020 [employer] ° °

## 2019-08-15 ENCOUNTER — Other Ambulatory Visit: Payer: Self-pay

## 2019-08-15 ENCOUNTER — Ambulatory Visit (INDEPENDENT_AMBULATORY_CARE_PROVIDER_SITE_OTHER): Payer: 59 | Admitting: Obstetrics and Gynecology

## 2019-08-15 VITALS — BP 126/84 | Wt 155.0 lb

## 2019-08-15 DIAGNOSIS — Z3A35 35 weeks gestation of pregnancy: Secondary | ICD-10-CM

## 2019-08-15 DIAGNOSIS — O4693 Antepartum hemorrhage, unspecified, third trimester: Secondary | ICD-10-CM

## 2019-08-15 DIAGNOSIS — O0993 Supervision of high risk pregnancy, unspecified, third trimester: Secondary | ICD-10-CM | POA: Diagnosis not present

## 2019-08-15 DIAGNOSIS — O4593 Premature separation of placenta, unspecified, third trimester: Secondary | ICD-10-CM

## 2019-08-15 DIAGNOSIS — O09899 Supervision of other high risk pregnancies, unspecified trimester: Secondary | ICD-10-CM

## 2019-08-15 NOTE — Progress Notes (Signed)
Routine Prenatal Care Visit  Subjective  Dorothy Townsend is a 29 y.o. G2P0101 at [redacted]w[redacted]d being seen today for ongoing prenatal care.  She is currently monitored for the following issues for this high-risk pregnancy and has GAD (generalized anxiety disorder); Irritable bowel syndrome with constipation; Screening cholesterol level; Other fatigue; Constipation; Supervision of high risk pregnancy, antepartum; History of preterm delivery, currently pregnant; Constipation during pregnancy; Placenta previa antepartum; Vaginal bleeding during pregnancy; Vaginal bleeding in pregnancy, third trimester; Placental abruption in third trimester; Antepartum placental abruption; and Pregnancy-induced hypertension in third trimester on their problem list.  ----------------------------------------------------------------------------------- Patient reports intermittent contractions, sometimes strong.  No vaginal bleeding.   Contractions: Irregular. Vag. Bleeding: None.  Movement: Present. Leaking Fluid denies.  ----------------------------------------------------------------------------------- The following portions of the patient's history were reviewed and updated as appropriate: allergies, current medications, past family history, past medical history, past social history, past surgical history and problem list. Problem list updated.  Objective  Blood pressure 126/84, weight 155 lb (70.3 kg), last menstrual period 12/09/2018. Pregravid weight 131 lb (59.4 kg) Total Weight Gain 24 lb (10.9 kg) Urinalysis: Urine Protein    Urine Glucose    Fetal Status: Fetal Heart Rate (bpm): 145   Movement: Present  Presentation: Vertex  General:  Alert, oriented and cooperative. Patient is in no acute distress.  Skin: Skin is warm and dry. No rash noted.   Cardiovascular: Normal heart rate noted  Respiratory: Normal respiratory effort, no problems with respiration noted  Abdomen: Soft, gravid, appropriate for gestational  age. Pain/Pressure: Present     Pelvic:  Cervical exam performed Dilation: 3 Effacement (%): 60 Station: -2  Extremities: Normal range of motion.     Mental Status: Normal mood and affect. Normal behavior. Normal judgment and thought content.   BSUS: Cephalic presentation  NST: Baseline FHR: 145 beats/min Variability: moderate Accelerations: present Decelerations: absent Tocometry: not done  Interpretation:  INDICATIONS: history of chronic abruption RESULTS:  A NST procedure was performed with FHR monitoring and a normal baseline established, appropriate time of 20-40 minutes of evaluation, and accels >2 seen w 15x15 characteristics.  Results show a REACTIVE NST.    Assessment   29 y.o. G2P0101 at [redacted]w[redacted]d by  09/15/2019, by Last Menstrual Period presenting for routine prenatal visit  Plan   pregnancy Problems (from 01/28/19 to present)    Problem Noted Resolved   Vaginal bleeding during pregnancy 06/10/2019 by Conard Novak, MD No   Placenta previa antepartum 04/20/2019 by Conard Novak, MD No   Overview Signed 06/10/2019  9:15 AM by Conard Novak, MD    Resolved at 26 weeks      Constipation during pregnancy 02/14/2019 by Conard Novak, MD No   Constipation 01/28/2019 by Conard Novak, MD No   Supervision of high risk pregnancy, antepartum 01/28/2019 by Conard Novak, MD No   Overview Addendum 07/28/2019 12:27 PM by Vena Austria, MD    Clinic [facility] Prenatal Labs  Dating L=7 Blood type: B/Positive/-- (01/15 1255)   Genetic Screen 1 Screen:    AFP:     Quad:     NIPS: Antibody:Negative (01/15 1255)  Anatomic [place] complete Rubella: 9.10 (01/15 1255)  Varicella: Immune  GTT Third trimester: 129 RPR: Non Reactive (01/15 1255)   Rhogam n/a HBsAg: Negative (01/15 1255)   TDaP vaccine 07/13/2019  Flu Shot: HIV: Non Reactive (01/15 1255)   Baby Food  GBS:   Contraception  Pap:  CBB     CS/VBAC    Support Person Azzie Glatter           Previous Version   History of preterm delivery, currently pregnant 01/28/2019 by Conard Novak, MD No   Overview Addendum 06/20/2019 11:31 AM by Conard Novak, MD    [x ] progesterone vaginal suppositories [x]  16 week cervical length u/s      Previous Version       Preterm labor symptoms and general obstetric precautions including but not limited to vaginal bleeding, contractions, leaking of fluid and fetal movement were reviewed in detail with the patient. Please refer to After Visit Summary for other counseling recommendations.   Return in about 2 days (around 08/17/2019) for Routine Prenatal Appointment/NST.  10/17/2019, MD, [name] OB/GYN, [facility] 08/15/2019 9:20 AM

## 2019-08-17 ENCOUNTER — Encounter: Payer: Self-pay | Admitting: Obstetrics and Gynecology

## 2019-08-17 ENCOUNTER — Other Ambulatory Visit: Payer: Self-pay

## 2019-08-17 ENCOUNTER — Ambulatory Visit (INDEPENDENT_AMBULATORY_CARE_PROVIDER_SITE_OTHER): Payer: 59 | Admitting: Obstetrics and Gynecology

## 2019-08-17 ENCOUNTER — Ambulatory Visit (INDEPENDENT_AMBULATORY_CARE_PROVIDER_SITE_OTHER): Payer: 59

## 2019-08-17 VITALS — BP 118/74 | Wt 159.0 lb

## 2019-08-17 DIAGNOSIS — Z3A35 35 weeks gestation of pregnancy: Secondary | ICD-10-CM

## 2019-08-17 DIAGNOSIS — O4593 Premature separation of placenta, unspecified, third trimester: Secondary | ICD-10-CM

## 2019-08-17 DIAGNOSIS — O0990 Supervision of high risk pregnancy, unspecified, unspecified trimester: Secondary | ICD-10-CM

## 2019-08-17 DIAGNOSIS — O0993 Supervision of high risk pregnancy, unspecified, third trimester: Secondary | ICD-10-CM

## 2019-08-17 DIAGNOSIS — O4400 Placenta previa specified as without hemorrhage, unspecified trimester: Secondary | ICD-10-CM

## 2019-08-17 DIAGNOSIS — O09899 Supervision of other high risk pregnancies, unspecified trimester: Secondary | ICD-10-CM

## 2019-08-17 NOTE — Progress Notes (Signed)
Routine Prenatal Care Visit  Subjective  Dorothy Townsend is a 29 y.o. G2P0101 at [redacted]w[redacted]d being seen today for ongoing prenatal care.  She is currently monitored for the following issues for this high-risk pregnancy and has GAD (generalized anxiety disorder); Irritable bowel syndrome with constipation; Screening cholesterol level; Other fatigue; Constipation; Supervision of high risk pregnancy, antepartum; History of preterm delivery, currently pregnant; Constipation during pregnancy; Placenta previa antepartum; Vaginal bleeding during pregnancy; Vaginal bleeding in pregnancy, third trimester; Placental abruption in third trimester; Antepartum placental abruption; and Pregnancy-induced hypertension in third trimester on their problem list.  ----------------------------------------------------------------------------------- Patient reports feeling lots of pressure today.  Contractions last night.  A little more than her usual.   Contractions: Irregular. Vag. Bleeding: None.  Movement: Present. Leaking Fluid denies.  U/S: AFI 18.8 cm ----------------------------------------------------------------------------------- The following portions of the patient's history were reviewed and updated as appropriate: allergies, current medications, past family history, past medical history, past social history, past surgical history and problem list. Problem list updated.  Objective  Blood pressure 118/74, weight 159 lb (72.1 kg), last menstrual period 12/09/2018. Pregravid weight 131 lb (59.4 kg) Total Weight Gain 28 lb (12.7 kg) Urinalysis: Urine Protein    Urine Glucose    Fetal Status: Fetal Heart Rate (bpm): 150   Movement: Present  Presentation: Vertex  General:  Alert, oriented and cooperative. Patient is in no acute distress.  Skin: Skin is warm and dry. No rash noted.   Cardiovascular: Normal heart rate noted  Respiratory: Normal respiratory effort, no problems with respiration noted  Abdomen:  Soft, gravid, appropriate for gestational age. Pain/Pressure: Present     Pelvic:  Cervical exam performed Dilation: 3 Effacement (%): 60 Station: -2  Extremities: Normal range of motion.  Edema: None  Mental Status: Normal mood and affect. Normal behavior. Normal judgment and thought content.    NST: Baseline FHR: 150 beats/min Variability: moderate Accelerations: present Decelerations: absent Tocometry: not done  Interpretation:  INDICATIONS: chronic placental abruption RESULTS:  A NST procedure was performed with FHR monitoring and a normal baseline established, appropriate time of 20-40 minutes of evaluation, and accels >2 seen w 15x15 characteristics.  Results show a REACTIVE NST.    Imaging Results US OB Limited  Result Date: 08/17/2019 Patient Name: DALIA JOLLIE DOB: 1990/11/05 MRN: [id number] ULTRASOUND REPORT Location: [facility] Date of Service: 08/17/2019 Indications:AFI Findings: Mason Jim intrauterine pregnancy is visualized with FHR at 148 BPM. Fetal presentation is Cephalic. Placenta: posterior. Grade: 2 AFI: 18.8 cm Impression: 1. [redacted]w[redacted]d Viable Singleton Intrauterine pregnancy dated by previously established criteria. 2. AFI is 18.8 cm. Deanna Artis, RT The ultrasound images and findings were reviewed by me and I agree with the above report. [name], MD, Mer[facility], [facility] 08/17/2019 9:07 AM       Assessment   28 y.o. G2P0101 at [redacted]w[redacted]d by  09/15/2019, by Last Menstrual Period presenting for routine prenatal visit  Plan   pregnancy Problems (from 01/28/19 to present)    Problem Noted Resolved   Vaginal bleeding during pregnancy 06/10/2019 by Conard Novak, MD No   Placenta previa antepartum 04/20/2019 by Conard Novak, MD No   Overview Signed 06/10/2019  9:15 AM by Conard Novak, MD    Resolved at 26 weeks      Constipation during pregnancy 02/14/2019 by Conard Novak, MD No   Constipation 01/28/2019 by  Conard Novak, MD No   Supervision of high risk pregnancy, antepartum 01/28/2019  by Conard Novak, MD No   Overview Addendum 07/28/2019 12:27 PM by Vena Austria, MD    Clinic [facility] Prenatal Labs  Dating L=7 Blood type: B/Positive/-- (01/15 1255)   Genetic Screen 1 Screen:    AFP:     Quad:     NIPS: Antibody:Negative (01/15 1255)  Anatomic [place] complete Rubella: 9.10 (01/15 1255)  Varicella: Immune  GTT Third trimester: 129 RPR: Non Reactive (01/15 1255)   Rhogam n/a HBsAg: Negative (01/15 1255)   TDaP vaccine 07/13/2019  Flu Shot: HIV: Non Reactive (01/15 1255)   Baby Food                                GBS:   Contraception  Pap:  CBB     CS/VBAC    Support Person Azzie Glatter           Previous Version   History of preterm delivery, currently pregnant 01/28/2019 by Conard Novak, MD No   Overview Addendum 06/20/2019 11:31 AM by Conard Novak, MD    [x ] progesterone vaginal suppositories [x]  16 week cervical length u/s      Previous Version       Preterm labor symptoms and general obstetric precautions including but not limited to vaginal bleeding, contractions, leaking of fluid and fetal movement were reviewed in detail with the patient. Please refer to After Visit Summary for other counseling recommendations.   Twice weekly NSTs. IOL at 37 weeks (already scheduled) or as indicated.   Return for Keep previously scheduled appts (add NST to 8/13 appt).  9/13, MD, [name] OB/GYN, [facility] 08/17/2019 9:27 AM

## 2019-08-22 ENCOUNTER — Encounter: Payer: Self-pay | Admitting: Obstetrics & Gynecology

## 2019-08-22 ENCOUNTER — Other Ambulatory Visit: Payer: Self-pay

## 2019-08-22 ENCOUNTER — Observation Stay (HOSPITAL_BASED_OUTPATIENT_CLINIC_OR_DEPARTMENT_OTHER)
Admission: EM | Admit: 2019-08-22 | Discharge: 2019-08-22 | Disposition: A | Discharge status: Home / Self Care | Payer: 59 | Source: Home / Self Care | Admitting: Advanced Practice Midwife

## 2019-08-22 ENCOUNTER — Ambulatory Visit (INDEPENDENT_AMBULATORY_CARE_PROVIDER_SITE_OTHER): Payer: 59 | Admitting: Obstetrics and Gynecology

## 2019-08-22 VITALS — BP 136/64 | Wt 158.0 lb

## 2019-08-22 DIAGNOSIS — Z3A36 36 weeks gestation of pregnancy: Secondary | ICD-10-CM

## 2019-08-22 DIAGNOSIS — O26893 Other specified pregnancy related conditions, third trimester: Secondary | ICD-10-CM | POA: Diagnosis not present

## 2019-08-22 DIAGNOSIS — O26853 Spotting complicating pregnancy, third trimester: Secondary | ICD-10-CM | POA: Insufficient documentation

## 2019-08-22 DIAGNOSIS — O4690 Antepartum hemorrhage, unspecified, unspecified trimester: Secondary | ICD-10-CM

## 2019-08-22 DIAGNOSIS — F909 Attention-deficit hyperactivity disorder, unspecified type: Secondary | ICD-10-CM | POA: Insufficient documentation

## 2019-08-22 DIAGNOSIS — K5909 Other constipation: Secondary | ICD-10-CM

## 2019-08-22 DIAGNOSIS — O0990 Supervision of high risk pregnancy, unspecified, unspecified trimester: Secondary | ICD-10-CM

## 2019-08-22 DIAGNOSIS — O09899 Supervision of other high risk pregnancies, unspecified trimester: Secondary | ICD-10-CM

## 2019-08-22 DIAGNOSIS — K5900 Constipation, unspecified: Secondary | ICD-10-CM

## 2019-08-22 DIAGNOSIS — O4693 Antepartum hemorrhage, unspecified, third trimester: Secondary | ICD-10-CM

## 2019-08-22 DIAGNOSIS — O4593 Premature separation of placenta, unspecified, third trimester: Secondary | ICD-10-CM | POA: Diagnosis not present

## 2019-08-22 DIAGNOSIS — O4400 Placenta previa specified as without hemorrhage, unspecified trimester: Secondary | ICD-10-CM

## 2019-08-22 DIAGNOSIS — M549 Dorsalgia, unspecified: Secondary | ICD-10-CM | POA: Insufficient documentation

## 2019-08-22 DIAGNOSIS — O99612 Diseases of the digestive system complicating pregnancy, second trimester: Secondary | ICD-10-CM

## 2019-08-22 LAB — POCT URINALYSIS DIPSTICK OB
Glucose, UA: NEGATIVE
POC,PROTEIN,UA: NEGATIVE

## 2019-08-22 LAB — KLEIHAUER-BETKE STAIN
Fetal Cells %: 0 %
Quantitation Fetal Hemoglobin: 0 mL

## 2019-08-22 LAB — RUPTURE OF MEMBRANE (ROM)PLUS: Rom Plus: NEGATIVE

## 2019-08-22 NOTE — Discharge Summary (Signed)
Physician Final Progress Note  Patient ID: Dorothy Townsend MRN: [id number] DOB/AGE: 09-Apr-1990 29 y.o.  Admit date: 08/22/2019 Admitting provider: Tresea Townsend, CNM Discharge date: 08/22/2019   Admission Diagnoses: non-reactive NST in office, bleeding yesterday  Discharge Diagnoses:  Active Problems:   Labor and delivery, indication for care IUP at 36 weeks Reactive NST No bleeding No cervical change  History of Present Illness: The patient is a 29 y.o. female G2P0101 at [redacted]w[redacted]d who presents from the office due to non-reactive NST while there this morning. She mentions having brown vaginal discharge yesterday and none today. Her contractions have been ongoing. She is admitted for observation, placed on monitors, labs collected, cervical exam with repeat after 4 hours.  Labs and monitoring have been reassuring. No cervical change while in triage. Contractions have become more frequent and intense with some decrease in frequency by time of discharge. She is discharged to home with instructions and precautions. She is scheduled for IOL on Saturday of this week.   Past Medical History:  Diagnosis Date  . Acne   . ADD (attention deficit disorder)   . ADHD (attention deficit hyperactivity disorder)    history of  . Anxiety    history of  . Constipation   . Immunization, viral disease    GARDASIL COMPLETED  . Personal history of kidney stones   . Pregnancy-induced hypertension in third trimester     Past Surgical History:  Procedure Laterality Date  . EXCISION OF KELOID    . EXTERNAL EAR SURGERY Bilateral 2012   NODULES REMOVED   . TONSILLECTOMY  2015  . WISDOM TOOTH EXTRACTION      No current facility-administered medications on file prior to encounter.   Current Outpatient Medications on File Prior to Encounter  Medication Sig Dispense Refill  . Prenatal Vit-Fe Fumarate-FA (PRENATAL VITAMIN PO) Take by mouth.      Allergies  Allergen Reactions  . Fluvoxamine Anxiety     Social History   Socioeconomic History  . Marital status: Married    Spouse name: Not on file  . Number of children: 0  . Years of education: 37  . Highest education level: Not on file  Occupational History  . Occupation: Stylist    Comment: [employer]  Tobacco Use  . Smoking status: Never Smoker  . Smokeless tobacco: Never Used  Vaping Use  . Vaping Use: Never used  Substance and Sexual Activity  . Alcohol use: Yes    Alcohol/week: 0.0 standard drinks    Comment: seldom  . Drug use: No  . Sexual activity: Yes    Partners: Male    Birth control/protection: None  Other Topics Concern  . Not on file  Social History Narrative  . Not on file   Social Determinants of Health   Financial Resource Strain:   . Difficulty of Paying Living Expenses:   Food Insecurity:   . Worried About Programme researcher, broadcasting/film/video in the Last Year:   . Barista in the Last Year:   Transportation Needs:   . Freight forwarder (Medical):   Marland Kitchen Lack of Transportation (Non-Medical):   Physical Activity:   . Days of Exercise per Week:   . Minutes of Exercise per Session:   Stress:   . Feeling of Stress :   Social Connections:   . Frequency of Communication with Friends and Family:   . Frequency of Social Gatherings with Friends and Family:   . Attends Religious Services:   .  Active Member of Clubs or Organizations:   . Attends Banker Meetings:   Marland Kitchen Marital Status:   Intimate Partner Violence:   . Fear of Current or Ex-Partner:   . Emotionally Abused:   Marland Kitchen Physically Abused:   . Sexually Abused:     Family History  Problem Relation Age of Onset  . Diabetes Maternal Grandfather   . Cancer Other        UNKNOWN AGE/TYPE-THINKS THROAT     Review of Systems  Constitutional: Negative for chills and fever.  HENT: Negative for congestion, ear discharge, ear pain, hearing loss, sinus pain and sore throat.   Eyes: Negative for blurred vision and double vision.   Respiratory: Negative for cough, shortness of breath and wheezing.   Cardiovascular: Negative for chest pain, palpitations and leg swelling.  Gastrointestinal: Positive for abdominal pain. Negative for blood in stool, constipation, diarrhea, heartburn, melena, nausea and vomiting.  Genitourinary: Negative for dysuria, flank pain, frequency, hematuria and urgency.  Musculoskeletal: Positive for back pain. Negative for joint pain and myalgias.  Skin: Negative for itching and rash.  Neurological: Negative for dizziness, tingling, tremors, sensory change, speech change, focal weakness, seizures, loss of consciousness, weakness and headaches.  Endo/Heme/Allergies: Negative for environmental allergies. Does not bruise/bleed easily.  Psychiatric/Behavioral: Negative for depression, hallucinations, memory loss, substance abuse and suicidal ideas. The patient is not nervous/anxious and does not have insomnia.      Physical Exam: BP 122/75 (BP Location: Left Arm)   Pulse 100   Temp 98.1 F (36.7 C) (Oral)   Resp 16   Ht 5\' 1"  (1.549 m)   Wt 71.7 kg   LMP 12/09/2018   BMI 29.85 kg/m   Constitutional: Well nourished, well developed female in no acute distress.  HEENT: normal Skin: Warm and dry.  Cardiovascular: Regular rate and rhythm.   Extremity: no edema  Respiratory: Clear to auscultation bilateral. Normal respiratory effort Abdomen: FHT present Back: no CVAT Neuro: DTRs 2+, Cranial nerves grossly intact Psych: Alert and Oriented x3. No memory deficits. Normal mood and affect.  MS: normal gait, normal bilateral lower extremity ROM/strength/stability.  Pelvic exam: (female chaperone present) is not limited by body habitus EGBUS: within normal limits Vagina: within normal limits and with normal mucosa, no blood in the vault Cervix: 3.5/80/-2  Toco: have been every 2-3 minutes throughout the day and have spaced to every 5-6 minutes by time of discharge Fetal well being: 140 bpm,  moderate variability, +accelerations, -decelerations  Consults: none  Significant Findings/ Diagnostic Studies: labs:  Results for Dorothy, Townsend (MRN Dorothy Townsend) as of 08/22/2019 18:11  Ref. Range 08/22/2019 12:39 08/22/2019 17:07  Fetal Cells % Latest Units: % 0   Quantitation Fetal Hemoglobin Latest Units: mL 0.0000   # Vials RhIg Unknown NOT INDICATED   Rom Plus Unknown  NEGATIVE    Procedures: NST  Hospital Course: The patient was admitted to Labor and Delivery Triage for observation.   Discharge Condition: good  Disposition: Discharge disposition: 01-Home or Self Care  Diet: Regular diet  Discharge Activity: Activity as tolerated  Discharge Instructions    Discharge activity:  No Restrictions   Complete by: As directed    Discharge diet:  No restrictions   Complete by: As directed    Fetal Kick Count:  Lie on our left side for one hour after a meal, and count the number of times your baby kicks.  If it is less than 5 times, get up, move around and  drink some juice.  Repeat the test 30 minutes later.  If it is still less than 5 kicks in an hour, notify your doctor.   Complete by: As directed    Notify physician for a general feeling that "something is not right"   Complete by: As directed    Notify physician for increase or change in vaginal discharge   Complete by: As directed    Notify physician for intestinal cramps, with or without diarrhea, sometimes described as "gas pain"   Complete by: As directed    Notify physician for leaking of fluid   Complete by: As directed    Notify physician for low, dull backache, unrelieved by heat or Tylenol   Complete by: As directed    Notify physician for menstrual like cramps   Complete by: As directed    Notify physician for pelvic pressure   Complete by: As directed    Notify physician for uterine contractions.  These may be painless and feel like the uterus is tightening or the baby is  "balling up"   Complete by: As  directed    Notify physician for vaginal bleeding   Complete by: As directed    PRETERM LABOR:  Includes any of the follwing symptoms that occur between 20 - [redacted] weeks gestation.  If these symptoms are not stopped, preterm labor can result in preterm delivery, placing your baby at risk   Complete by: As directed      Allergies as of 08/22/2019      Reactions   Fluvoxamine Anxiety      Medication List    TAKE these medications   PRENATAL VITAMIN PO Take by mouth.        Total time spent taking care of this patient: 45 minutes  Signed: Tresea Townsend, CNM  08/22/2019, 6:04 PM

## 2019-08-22 NOTE — Progress Notes (Signed)
ROB NST 

## 2019-08-22 NOTE — Progress Notes (Signed)
Routine Prenatal Care Visit  Subjective  Dorothy Townsend is a 29 y.o. G2P0101 at [redacted]w[redacted]d being seen today for ongoing prenatal care.  She is currently monitored for the following issues for this high-risk pregnancy and has GAD (generalized anxiety disorder); Irritable bowel syndrome with constipation; Screening cholesterol level; Other fatigue; Constipation; Supervision of high risk pregnancy, antepartum; History of preterm delivery, currently pregnant; Constipation during pregnancy; Placenta previa antepartum; Vaginal bleeding during pregnancy; Vaginal bleeding in pregnancy, third trimester; Placental abruption in third trimester; Antepartum placental abruption; and Pregnancy-induced hypertension in third trimester on their problem list.  ----------------------------------------------------------------------------------- Patient reports no complaints.   Contractions: Irregular. Vag. Bleeding: None.  Movement: Present. Denies leaking of fluid. Does report more discharge and yesterday had some vaginal bleeding.  Dark brown none today.  NO change in contractions ----------------------------------------------------------------------------------- The following portions of the patient's history were reviewed and updated as appropriate: allergies, current medications, past family history, past medical history, past social history, past surgical history and problem list. Problem list updated.   Objective  Blood pressure 136/64, weight 158 lb (71.7 kg), last menstrual period 12/09/2018. Pregravid weight 131 lb (59.4 kg) Total Weight Gain 27 lb (12.2 kg) Urinalysis:      Fetal Status: Fetal Heart Rate (bpm): 145   Movement: Present  Presentation: Vertex  General:  Alert, oriented and cooperative. Patient is in no acute distress.  Skin: Skin is warm and dry. No rash noted.   Cardiovascular: Normal heart rate noted  Respiratory: Normal respiratory effort, no problems with respiration noted  Abdomen:  Soft, gravid, appropriate for gestational age. Pain/Pressure: Present     Pelvic:  Cervical exam deferred Dilation: 3 Effacement (%): 60 Station: -2  Extremities: Normal range of motion.     ental Status: Normal mood and affect. Normal behavior. Normal judgment and thought content.   Baseline: 145 Variability: moderate Accelerations: absent Decelerations: absent Tocometry: N?A The patient was monitored for 30 minutes, fetal heart rate tracing was deemed non-reactive, category I tracing,  CPT [phone]   Assessment   28 y.o. G2P0101 at [redacted]w[redacted]d by  09/15/2019, by Last Menstrual Period presenting for routine prenatal visit  Plan   pregnancy Problems (from 01/28/19 to present)    Problem Noted Resolved   Vaginal bleeding during pregnancy 06/10/2019 by Conard Novak, MD No   Placenta previa antepartum 04/20/2019 by Conard Novak, MD No   Overview Signed 06/10/2019  9:15 AM by Conard Novak, MD    Resolved at 26 weeks      Constipation during pregnancy 02/14/2019 by Conard Novak, MD No   Constipation 01/28/2019 by Conard Novak, MD No   Supervision of high risk pregnancy, antepartum 01/28/2019 by Conard Novak, MD No   Overview Addendum 07/28/2019 12:27 PM by Vena Austria, MD    Clinic [facility] Prenatal Labs  Dating L=7 Blood type: B/Positive/-- (01/15 1255)   Genetic Screen 1 Screen:    AFP:     Quad:     NIPS: Antibody:Negative (01/15 1255)  Anatomic [place] complete Rubella: 9.10 (01/15 1255)  Varicella: Immune  GTT Third trimester: 129 RPR: Non Reactive (01/15 1255)   Rhogam n/a HBsAg: Negative (01/15 1255)   TDaP vaccine 07/13/2019  Flu Shot: HIV: Non Reactive (01/15 1255)   Baby Food                                GBS:  Contraception  Pap:  CBB     CS/VBAC    Support Person Azzie Glatter           Previous Version   History of preterm delivery, currently pregnant 01/28/2019 by Conard Novak, MD No   Overview Addendum 06/20/2019 11:31 AM by  Conard Novak, MD    [x ] progesterone vaginal suppositories [x]  16 week cervical length u/s      Previous Version       Gestational age appropriate obstetric precautions including but not limited to vaginal bleeding, contractions, leaking of fluid and fetal movement were reviewed in detail with the patient.    To L&D episode of bleeding yesterday, dark brown none since.  NST today not reactive to L&D for extended monitoring  Ferning negative  Return in about 4 days (around 08/26/2019) for ROB with 08/28/2019.  Jean Rosenthal, MD, Vena Austria [facility], [facility] 08/22/2019, 10:08 AM

## 2019-08-22 NOTE — OB Triage Note (Signed)
RN reviewed discharge instructions with patient and patient verbalized understanding. Reviewed plan for induction on 08/27/19. Discharged via wheelchair with support person.

## 2019-08-22 NOTE — Progress Notes (Signed)
S: Patient is visibly more uncomfortable with more frequent contractions. Still overall coping well. She had some brown discharge yesterday and no bleeding today. She was checked in the office this morning by Dr Bonney Aid and was 3/60/-2.  O: Vital Signs: BP 117/80 (BP Location: Left Arm)   Pulse 90   Temp 98.8 F (37.1 C) (Oral)   Resp 18   Ht 5\' 1"  (1.549 m)   Wt 71.7 kg   LMP 12/09/2018   BMI 29.85 kg/m  Constitutional: Well nourished, well developed female in no acute distress.  HEENT: normal Extremity: no edema  Respiratory: Normal respiratory effort Abdomen: FHT present, contraction strong to palpation Neuro: DTRs 2+, Cranial nerves grossly intact Psych: Alert and Oriented x3. No memory deficits. Normal mood and affect.   Pelvic exam: (female chaperone present) is not limited by body habitus EGBUS: within normal limits Vagina: within normal limits and with normal mucosa, 1 small spot of blood on exam glove Cervix: 3.5/70-80/-2, posterior  Toco: q 2-4 minutes Fetal well being: 135 bpm, moderate variability, +accelerations, -decelerations  A: 29 y.o. G2 P32 female with IUP at 36 weeks 4 days, threatened preterm labor, chronic abruption, reassuring fetal tracing  P: Kleihauer-Betke pending Fetal monitoring May eat regular diet Monitor for bleeding, further cervical dilation Has IOL scheduled for 08/27/19   08/29/19, CNM [facility] 08/22/2019, 1:59 PM   .

## 2019-08-22 NOTE — Progress Notes (Signed)
Patient has had contractions since 26 weeks. Today they have felt more intense than at previous times. No change in cervical exam. She complains of some fluid dripping after using the bathroom. ROM plus collected. We discussed plan if not laboring to discharge to home and utilize bathtub with epsom salts, tylenol PM, staying hydrated. Will wait for ROM plus to result prior to making decision of discharge. Patient is agreeable with plan.   Vital Signs: BP 122/75 (BP Location: Left Arm)   Pulse 100   Temp 98.1 F (36.7 C) (Oral)   Resp 16   Ht 5\' 1"  (1.549 m)   Wt 71.7 kg   LMP 12/09/2018   BMI 29.85 kg/m  Constitutional: Well nourished, well developed female in no acute distress.  HEENT: normal Skin: Warm and dry.  Extremity: no edema  Respiratory: Clear to auscultation bilateral. Normal respiratory effort Abdomen: FHT present Neuro: DTRs 2+, Cranial nerves grossly intact Psych: Alert and Oriented x3. No memory deficits. Normal mood and affect.   Pelvic exam: (female chaperone present) is not limited by body habitus EGBUS: within normal limits Vagina: within normal limits and with normal mucosa, no blood in the vault Cervix: 3.5/80/-2 and still posterior  Toco: every 2-4 minutes Fetal well being: 150 bpm, moderate variability, +accelerations, -decelerations   Results for KSENIYA, GRUNDEN (MRN [name]) as of 08/22/2019 17:17  Ref. Range 08/22/2019 12:39  Fetal Cells % Latest Units: % 0  Quantitation Fetal Hemoglobin Latest Units: mL 0.0000  # Vials RhIg Unknown NOT INDICATED   ROM plus pending  10/22/2019, CNM

## 2019-08-25 ENCOUNTER — Other Ambulatory Visit: Payer: Self-pay

## 2019-08-25 ENCOUNTER — Inpatient Hospital Stay
Admission: EM | Admit: 2019-08-25 | Discharge: 2019-08-27 | DRG: 807 | Disposition: A | Discharge status: Home / Self Care | Payer: 59 | Attending: Obstetrics and Gynecology | Admitting: Obstetrics and Gynecology

## 2019-08-25 ENCOUNTER — Inpatient Hospital Stay: Payer: 59 | Admitting: Anesthesiology

## 2019-08-25 ENCOUNTER — Ambulatory Visit (INDEPENDENT_AMBULATORY_CARE_PROVIDER_SITE_OTHER): Payer: 59 | Admitting: Obstetrics and Gynecology

## 2019-08-25 ENCOUNTER — Encounter: Payer: Self-pay | Admitting: Obstetrics and Gynecology

## 2019-08-25 ENCOUNTER — Encounter: Payer: Self-pay | Admitting: Obstetrics & Gynecology

## 2019-08-25 VITALS — Wt 156.0 lb

## 2019-08-25 DIAGNOSIS — Z3A37 37 weeks gestation of pregnancy: Secondary | ICD-10-CM

## 2019-08-25 DIAGNOSIS — Z20822 Contact with and (suspected) exposure to covid-19: Secondary | ICD-10-CM | POA: Diagnosis present

## 2019-08-25 DIAGNOSIS — O4593 Premature separation of placenta, unspecified, third trimester: Principal | ICD-10-CM | POA: Diagnosis present

## 2019-08-25 DIAGNOSIS — O4693 Antepartum hemorrhage, unspecified, third trimester: Secondary | ICD-10-CM | POA: Diagnosis not present

## 2019-08-25 DIAGNOSIS — O99824 Streptococcus B carrier state complicating childbirth: Secondary | ICD-10-CM | POA: Diagnosis present

## 2019-08-25 DIAGNOSIS — R109 Unspecified abdominal pain: Secondary | ICD-10-CM | POA: Diagnosis present

## 2019-08-25 DIAGNOSIS — K5900 Constipation, unspecified: Secondary | ICD-10-CM

## 2019-08-25 DIAGNOSIS — O09899 Supervision of other high risk pregnancies, unspecified trimester: Secondary | ICD-10-CM

## 2019-08-25 DIAGNOSIS — O09893 Supervision of other high risk pregnancies, third trimester: Secondary | ICD-10-CM

## 2019-08-25 DIAGNOSIS — O4690 Antepartum hemorrhage, unspecified, unspecified trimester: Secondary | ICD-10-CM

## 2019-08-25 DIAGNOSIS — O26893 Other specified pregnancy related conditions, third trimester: Secondary | ICD-10-CM | POA: Diagnosis present

## 2019-08-25 DIAGNOSIS — O0993 Supervision of high risk pregnancy, unspecified, third trimester: Secondary | ICD-10-CM

## 2019-08-25 DIAGNOSIS — K5909 Other constipation: Secondary | ICD-10-CM

## 2019-08-25 DIAGNOSIS — O4400 Placenta previa specified as without hemorrhage, unspecified trimester: Secondary | ICD-10-CM

## 2019-08-25 DIAGNOSIS — O0990 Supervision of high risk pregnancy, unspecified, unspecified trimester: Secondary | ICD-10-CM

## 2019-08-25 LAB — COMPREHENSIVE METABOLIC PANEL
ALT: 10 U/L (ref 0–44)
AST: 19 U/L (ref 15–41)
Albumin: 3.6 g/dL (ref 3.5–5.0)
Alkaline Phosphatase: 138 U/L — ABNORMAL HIGH (ref 38–126)
Anion gap: 11 (ref 5–15)
BUN: <5 mg/dL — ABNORMAL LOW (ref 6–20)
CO2: 21 mmol/L — ABNORMAL LOW (ref 22–32)
Calcium: 9.2 mg/dL (ref 8.9–10.3)
Chloride: 103 mmol/L (ref 98–111)
Creatinine, Ser: 0.43 mg/dL — ABNORMAL LOW (ref 0.44–1.00)
GFR calc Af Amer: >60 mL/min (ref >60)
GFR calc non Af Amer: >60 mL/min (ref >60)
Glucose, Bld: 81 mg/dL (ref 70–99)
Potassium: 4 mmol/L (ref 3.5–5.1)
Sodium: 135 mmol/L (ref 135–145)
Total Bilirubin: 0.8 mg/dL (ref 0.3–1.2)
Total Protein: 7 g/dL (ref 6.5–8.1)

## 2019-08-25 LAB — CBC
HCT: 37.1 % (ref 36.0–46.0)
Hemoglobin: 13.1 g/dL (ref 12.0–15.0)
MCH: 30.6 pg (ref 26.0–34.0)
MCHC: 35.3 g/dL (ref 30.0–36.0)
MCV: 86.7 fL (ref 80.0–100.0)
Platelets: 209 10*3/uL (ref 150–400)
RBC: 4.28 MIL/uL (ref 3.87–5.11)
RDW: 12.5 % (ref 11.5–15.5)
WBC: 9.1 10*3/uL (ref 4.0–10.5)
nRBC: 0 % (ref 0.0–0.2)

## 2019-08-25 LAB — FETAL NONSTRESS TEST

## 2019-08-25 LAB — PROTEIN / CREATININE RATIO, URINE
Creatinine, Urine: 15 mg/dL
Total Protein, Urine: <6 mg/dL

## 2019-08-25 LAB — TYPE AND SCREEN
ABO/RH(D): B POS
Antibody Screen: NEGATIVE

## 2019-08-25 LAB — RUPTURE OF MEMBRANE (ROM)PLUS: Rom Plus: NEGATIVE

## 2019-08-25 LAB — SARS CORONAVIRUS 2 BY RT PCR (HOSPITAL ORDER, PERFORMED IN ~~LOC~~ HOSPITAL LAB): SARS Coronavirus 2: NEGATIVE

## 2019-08-25 MED ORDER — OXYTOCIN BOLUS FROM INFUSION
333.0000 mL | Freq: Once | INTRAVENOUS | Status: AC
  Administered 2019-08-26: 333 mL via INTRAVENOUS

## 2019-08-25 MED ORDER — DIPHENHYDRAMINE HCL 50 MG/ML IJ SOLN: 12.5000 mg | INTRAMUSCULAR | Status: DC | PRN

## 2019-08-25 MED ORDER — ONDANSETRON HCL 4 MG/2ML IJ SOLN
4.0000 mg | Freq: Four times a day (QID) | INTRAMUSCULAR | Status: DC | PRN
  Administered 2019-08-26: 4 mg via INTRAVENOUS
  Filled 2019-08-25: qty 2

## 2019-08-25 MED ORDER — PHENYLEPHRINE 40 MCG/ML (10ML) SYRINGE FOR IV PUSH (FOR BLOOD PRESSURE SUPPORT): 80.0000 ug | PREFILLED_SYRINGE | INTRAVENOUS | Status: DC | PRN

## 2019-08-25 MED ORDER — OXYTOCIN 10 UNIT/ML IJ SOLN: 10.0000 [IU] | Freq: Once | INTRAMUSCULAR | Status: DC

## 2019-08-25 MED ORDER — OXYTOCIN 10 UNIT/ML IJ SOLN
INTRAMUSCULAR | Status: AC
  Filled 2019-08-25: qty 2

## 2019-08-25 MED ORDER — AMMONIA AROMATIC IN INHA
RESPIRATORY_TRACT | Status: AC
  Filled 2019-08-25: qty 10

## 2019-08-25 MED ORDER — LIDOCAINE-EPINEPHRINE (PF) 1.5 %-1:200000 IJ SOLN
INTRAMUSCULAR | Status: DC | PRN
  Administered 2019-08-25: 12 mL via PERINEURAL

## 2019-08-25 MED ORDER — SOD CITRATE-CITRIC ACID 500-334 MG/5ML PO SOLN
30.0000 mL | ORAL | Status: DC | PRN
  Administered 2019-08-26: 30 mL via ORAL
  Filled 2019-08-25: qty 30

## 2019-08-25 MED ORDER — LACTATED RINGERS IV SOLN: 500.0000 mL | Freq: Once | INTRAVENOUS | Status: DC

## 2019-08-25 MED ORDER — MISOPROSTOL 200 MCG PO TABS
ORAL_TABLET | ORAL | Status: AC
  Filled 2019-08-25: qty 4

## 2019-08-25 MED ORDER — OXYTOCIN-SODIUM CHLORIDE 30-0.9 UT/500ML-% IV SOLN
1.0000 m[IU]/min | INTRAVENOUS | Status: DC
  Administered 2019-08-25: 1 m[IU]/min via INTRAVENOUS

## 2019-08-25 MED ORDER — PENICILLIN G POT IN DEXTROSE 60000 UNIT/ML IV SOLN
3.0000 10*6.[IU] | INTRAVENOUS | Status: DC
  Administered 2019-08-25: 3 10*6.[IU] via INTRAVENOUS
  Filled 2019-08-25: qty 50

## 2019-08-25 MED ORDER — SODIUM CHLORIDE 0.9 % IV SOLN
INTRAVENOUS | Status: DC | PRN
  Administered 2019-08-25 (×3): 5 mL via EPIDURAL

## 2019-08-25 MED ORDER — SODIUM CHLORIDE 0.9 % IV SOLN
5.0000 10*6.[IU] | Freq: Once | INTRAVENOUS | Status: AC
  Administered 2019-08-25: 5 10*6.[IU] via INTRAVENOUS
  Filled 2019-08-25: qty 5

## 2019-08-25 MED ORDER — LIDOCAINE HCL (PF) 1 % IJ SOLN: 30.0000 mL | INTRAMUSCULAR | Status: DC | PRN

## 2019-08-25 MED ORDER — TERBUTALINE SULFATE 1 MG/ML IJ SOLN: 0.2500 mg | Freq: Once | INTRAMUSCULAR | Status: DC | PRN

## 2019-08-25 MED ORDER — LACTATED RINGERS IV SOLN: INTRAVENOUS | Status: DC

## 2019-08-25 MED ORDER — LACTATED RINGERS IV SOLN: 500.0000 mL | INTRAVENOUS | Status: DC | PRN

## 2019-08-25 MED ORDER — EPHEDRINE 5 MG/ML INJ: 10.0000 mg | INTRAVENOUS | Status: DC | PRN

## 2019-08-25 MED ORDER — OXYTOCIN-SODIUM CHLORIDE 30-0.9 UT/500ML-% IV SOLN
2.5000 [IU]/h | INTRAVENOUS | Status: DC
  Filled 2019-08-25: qty 500

## 2019-08-25 MED ORDER — FENTANYL 2.5 MCG/ML W/ROPIVACAINE 0.15% IN NS 100 ML EPIDURAL (ARMC)
EPIDURAL | Status: AC
  Filled 2019-08-25: qty 100

## 2019-08-25 MED ORDER — LIDOCAINE HCL (PF) 1 % IJ SOLN
INTRAMUSCULAR | Status: AC
  Filled 2019-08-25: qty 30

## 2019-08-25 MED ORDER — LIDOCAINE HCL (PF) 1 % IJ SOLN
INTRAMUSCULAR | Status: DC | PRN
  Administered 2019-08-25: 2 mL

## 2019-08-25 MED ORDER — BUPIVACAINE HCL (PF) 0.25 % IJ SOLN
INTRAMUSCULAR | Status: AC
  Filled 2019-08-25: qty 30

## 2019-08-25 MED ORDER — FENTANYL 2.5 MCG/ML W/ROPIVACAINE 0.15% IN NS 100 ML EPIDURAL (ARMC): 12.0000 mL/h | EPIDURAL | Status: DC

## 2019-08-25 NOTE — H&P (Signed)
OB History & Physical   History of Present Illness:  Chief Complaint: regular uterine contractions  HPI:  Dorothy Townsend is a 29 y.o. G27P0101 female at [redacted]w[redacted]d dated by LMP consistent with 7 week ultrasound.  Her pregnancy has been complicated by vaginal bleeding in second and third trimester, placental abruption in third trimester with recommendation to delivery between 36-37 weeks by MFM, history of preterm labor and delivery with G1.    She reports contractions.   She denies leakage of fluid.   She denies vaginal bleeding.   She reports fetal movement.    Total weight gain for pregnancy: 11.3 kg   Obstetrical Problem List: pregnancy Problems (from 01/28/19 to present)    Problem Noted Resolved   Vaginal bleeding during pregnancy 06/10/2019 by Conard Novak, MD No   Placenta previa antepartum 04/20/2019 by Conard Novak, MD No   Overview Signed 06/10/2019  9:15 AM by Conard Novak, MD    Resolved at 26 weeks      Constipation during pregnancy 02/14/2019 by Conard Novak, MD No   Constipation 01/28/2019 by Conard Novak, MD No   Supervision of high risk pregnancy, antepartum 01/28/2019 by Conard Novak, MD No   Overview Addendum 07/28/2019 12:27 PM by Vena Austria, MD    Clinic [facility] Prenatal Labs  Dating L=7 Blood type: B/Positive/-- (01/15 1255)   Genetic Screen 1 Screen:    AFP:     Quad:     NIPS: Antibody:Negative (01/15 1255)  Anatomic [place] complete Rubella: 9.10 (01/15 1255)  Varicella: Immune  GTT Third trimester: 129 RPR: Non Reactive (01/15 1255)   Rhogam n/a HBsAg: Negative (01/15 1255)   TDaP vaccine 07/13/2019  Flu Shot: HIV: Non Reactive (01/15 1255)   Baby Food                                GBS:   Contraception  Pap:  CBB     CS/VBAC    Support Person Azzie Glatter           Previous Version   History of preterm delivery, currently pregnant 01/28/2019 by Conard Novak, MD No   Overview Addendum 06/20/2019 11:31 AM by Conard Novak, MD    [x ] progesterone vaginal suppositories [x]  16 week cervical length u/s      Previous Version       Maternal Medical History:   Past Medical History:  Diagnosis Date  . Acne   . ADD (attention deficit disorder)   . ADHD (attention deficit hyperactivity disorder)    history of  . Anxiety    history of  . Constipation   . Immunization, viral disease    GARDASIL COMPLETED  . Personal history of kidney stones   . Pregnancy-induced hypertension in third trimester     Past Surgical History:  Procedure Laterality Date  . EXCISION OF KELOID    . EXTERNAL EAR SURGERY Bilateral 2012   NODULES REMOVED   . TONSILLECTOMY  2015  . WISDOM TOOTH EXTRACTION      Allergies  Allergen Reactions  . Fluvoxamine Anxiety    Prior to Admission medications   Medication Sig Start Date End Date Taking? Authorizing Provider  Prenatal Vit-Fe Fumarate-FA (PRENATAL VITAMIN PO) Take by mouth.    [provider]    OB History  Gravida Para Term Preterm AB Living  2 1 0 1 0  1  SAB TAB Ectopic Multiple Live Births  0 0 0 0 1    # Outcome Date GA Lbr Len/2nd Weight Sex Delivery Anes PTL Lv  2 Current           1 Preterm 04/14/17 [redacted]w[redacted]d 11:22 / 00:58 2500 g M Vag-Spont EPI  LIV    Prenatal care site: [facility]  Social History: She  reports that she has never smoked. She has never used smokeless tobacco. She reports current alcohol use. She reports that she does not use drugs.  Family History: family history includes Cancer in an other family member; Diabetes in her maternal grandfather.   Review of Systems:  Review of Systems  Constitutional: Negative.   HENT: Negative.   Eyes: Negative.   Respiratory: Negative.   Cardiovascular: Negative.   Gastrointestinal: Positive for abdominal pain. Negative for blood in stool, constipation, diarrhea, heartburn, melena, nausea and vomiting.  Genitourinary: Negative.   Musculoskeletal: Negative.   Skin: Negative.    Neurological: Negative.   Endo/Heme/Allergies:       4/80/-2  Psychiatric/Behavioral: Negative.      Physical Exam:  BP 115/68 (BP Location: Right Arm)   Pulse 78   Temp 98.8 F (37.1 C) (Oral)   Resp 16   LMP 12/09/2018   Physical Exam Constitutional:      General: She is not in acute distress.    Appearance: Normal appearance. She is well-developed.  HENT:     Head: Normocephalic and atraumatic.  Eyes:     General: No scleral icterus.    Conjunctiva/sclera: Conjunctivae normal.  Cardiovascular:     Rate and Rhythm: Normal rate and regular rhythm.     Heart sounds: No murmur heard.  No friction rub. No gallop.   Pulmonary:     Effort: Pulmonary effort is normal. No respiratory distress.     Breath sounds: Normal breath sounds. No wheezing or rales.  Abdominal:     General: Bowel sounds are normal. There is no distension.     Palpations: Abdomen is soft.     Tenderness: There is no abdominal tenderness. There is no guarding or rebound.     Comments: Gravid, NT  Musculoskeletal:        General: Normal range of motion.     Cervical back: Normal range of motion and neck supple.  Neurological:     General: No focal deficit present.     Mental Status: She is alert and oriented to person, place, and time.     Cranial Nerves: No cranial nerve deficit.  Skin:    General: Skin is warm and dry.     Findings: No erythema.  Psychiatric:        Mood and Affect: Mood normal.        Behavior: Behavior normal.        Judgment: Judgment normal.   Female chaperone present for pelvic exam:   Lab Results  Component Value Date   SARSCOV2NAA NEGATIVE 08/25/2019   ROM Plus: negative  Assessment:  Dorothy Townsend is a 29 y.o. G57P0101 female at [redacted]w[redacted]d with normal labor, placental abruption this pregnancy in third trimester.   Plan:  1. Admit to Labor & Delivery  2. CBC, T&S, Clrs, IVF 3. GBS positive - PCN per protocol.   4. Fetwal well-being: reassuring 5. Admit for  delivery. Augment as indicated. Patient planned for IOL on 8/14 due to recommendation from MFM for delivery at 36-37 weeks due to chronic placental abruption.  [name], MD 08/25/2019 5:09 PM

## 2019-08-25 NOTE — Progress Notes (Signed)
Routine Prenatal Care Visit  Subjective  Dorothy Townsend is a 29 y.o. G2P0101 at [redacted]w[redacted]d being seen today for ongoing prenatal care.  She is currently monitored for the following issues for this high-risk pregnancy and has GAD (generalized anxiety disorder); Irritable bowel syndrome with constipation; Screening cholesterol level; Other fatigue; Constipation; Supervision of high risk pregnancy, antepartum; History of preterm delivery, currently pregnant; Constipation during pregnancy; Placenta previa antepartum; Vaginal bleeding during pregnancy; Vaginal bleeding in pregnancy, third trimester; Placental abruption in third trimester; Antepartum placental abruption; Pregnancy-induced hypertension in third trimester; and Labor and delivery, indication for care on their problem list.  ----------------------------------------------------------------------------------- Patient reports irregular ctx and painful.  Watery discharge, but no gross fluid leakage.   Contractions: Irregular. Vag. Bleeding: None.  Movement: Present. Leaking Fluid denies.  ----------------------------------------------------------------------------------- The following portions of the patient's history were reviewed and updated as appropriate: allergies, current medications, past family history, past medical history, past social history, past surgical history and problem list. Problem list updated.  Objective  Weight 156 lb (70.8 kg), last menstrual period 12/09/2018. Pregravid weight 131 lb (59.4 kg) Total Weight Gain 25 lb (11.3 kg) Urinalysis: Urine Protein    Urine Glucose    Fetal Status: Fetal Heart Rate (bpm): 150   Movement: Present  Presentation: Vertex  General:  Alert, oriented and cooperative. Patient is in no acute distress.  Skin: Skin is warm and dry. No rash noted.   Cardiovascular: Normal heart rate noted  Respiratory: Normal respiratory effort, no problems with respiration noted  Abdomen: Soft, gravid,  appropriate for gestational age. Pain/Pressure: Present     Pelvic:  Cervical exam performed Dilation: 4 Effacement (%): 70 Station: -2  Extremities: Normal range of motion.  Edema: None  Mental Status: Normal mood and affect. Normal behavior. Normal judgment and thought content.   Assessment   29 y.o. G2P0101 at [redacted]w[redacted]d by  09/15/2019, by Last Menstrual Period presenting for work-in prenatal visit  Plan   pregnancy Problems (from 01/28/19 to present)    Problem Noted Resolved   Vaginal bleeding during pregnancy 06/10/2019 by Conard Novak, MD No   Placenta previa antepartum 04/20/2019 by Conard Novak, MD No   Overview Signed 06/10/2019  9:15 AM by Conard Novak, MD    Resolved at 26 weeks      Constipation during pregnancy 02/14/2019 by Conard Novak, MD No   Constipation 01/28/2019 by Conard Novak, MD No   Supervision of high risk pregnancy, antepartum 01/28/2019 by Conard Novak, MD No   Overview Addendum 07/28/2019 12:27 PM by Vena Austria, MD    Clinic [facility] Prenatal Labs  Dating L=7 Blood type: B/Positive/-- (01/15 1255)   Genetic Screen 1 Screen:    AFP:     Quad:     NIPS: Antibody:Negative (01/15 1255)  Anatomic [place] complete Rubella: 9.10 (01/15 1255)  Varicella: Immune  GTT Third trimester: 129 RPR: Non Reactive (01/15 1255)   Rhogam n/a HBsAg: Negative (01/15 1255)   TDaP vaccine 07/13/2019  Flu Shot: HIV: Non Reactive (01/15 1255)   Baby Food                                GBS:   Contraception  Pap:  CBB     CS/VBAC    Support Person Azzie Glatter           Previous Version   History of preterm delivery, currently pregnant  01/28/2019 by Conard Novak, MD No   Overview Addendum 06/20/2019 11:31 AM by Conard Novak, MD    [x ] progesterone vaginal suppositories [x]  16 week cervical length u/s      Previous Version       Term labor symptoms and general obstetric precautions including but not limited to vaginal bleeding,  contractions, leaking of fluid and fetal movement were reviewed in detail with the patient. Please refer to After Visit Summary for other counseling recommendations.   To L&D for further evaluation of labor and BPs.  ROM+, if necessary.  May end up keeping for IOL depending on clinical scenario.   Return in about 2 days (around 08/27/2019) for IOL, previously scheduled.  08/29/2019, MD, [name] [facility], [facility] 08/25/2019 10:48 AM

## 2019-08-25 NOTE — Anesthesia Procedure Notes (Signed)
Epidural Patient location during procedure: OB Start time: 08/25/2019 8:16 PM End time: 08/25/2019 8:27 PM  Staffing Anesthesiologist: Karleen Hampshire, MD  Preanesthetic Checklist Completed: patient identified, IV checked, site marked, risks and benefits discussed, surgical consent, monitors and equipment checked, pre-op evaluation and timeout performed  Epidural Patient position: sitting Prep: ChloraPrep Patient monitoring: heart rate, continuous pulse ox and blood pressure Approach: midline Location: L3-L4 Injection technique: LOR saline  Needle:  Needle type: Tuohy  Needle gauge: 18 G Needle length: 9 cm and 9 Needle insertion depth: 5 cm Catheter type: closed end flexible Catheter size: 20 Guage Catheter at skin depth: 9 cm Test dose: negative and Other  Assessment Events: blood not aspirated, injection not painful, no injection resistance, no paresthesia and negative IV test  Additional Notes Risks and benefits of procedure discussed with patient.  Risks including but not limited to infection, spinal/epidural hematoma, nerve injury, post dural puncture headache, and inadequate/failed block.  Patient expressed understanding and consented to epidural placement. Negative dural puncture.  Negative aspiration.  Negative paresthesia on injection.  Dose given in divided aliquots.  Patient tolerated the procedure well with no immediate complications.   Reason for block:procedure for pain

## 2019-08-25 NOTE — Progress Notes (Signed)
Labor Check  Subj:  Complaints: more comfortable with epidural   Obj:  BP 116/69   Pulse 75   Temp 98.6 F (37 C) (Oral)   Resp 17   Ht 5\' 1"  (1.549 m)   Wt 70.8 kg   LMP 12/09/2018   SpO2 99%   BMI 29.48 kg/m  Dose (milli-units/min) Oxytocin: 3 milli-units/min  Cervix: Dilation: 5 / Effacement (%): 80 / Station: -2  AROM: clear fluid, abundant Baseline FHR: 125 beats/min   Variability: moderate   Accelerations: present   Decelerations: absent Contractions: present frequency: 3 q 10 min Overall assessment: cat 1  Female chaperone present for pelvic exam:   A/P: 29 y.o. G60P0101 female at [redacted]w[redacted]d with chronic abruption, labor.  1.  Labor: started pitocin and AROM to augment  2.  FWB: reassuring, Overall assessment: category 1  3.  GBS positive -PCN  4.  Pain: epidural 5.  Recheck: Q 2 hours/PRN   [redacted]w[redacted]d, MD, [name] [facility], [facility] 08/25/2019 11:06 PM

## 2019-08-25 NOTE — Anesthesia Preprocedure Evaluation (Signed)
Anesthesia Evaluation  Patient identified by MRN, date of birth, ID band Patient awake    Reviewed: Allergy & Precautions, H&P , NPO status , Patient's Chart, lab work & pertinent test results  Airway Mallampati: III  TM Distance: >3 FB     Dental   Pulmonary neg pulmonary ROS,           Cardiovascular Exercise Tolerance: Good (-) hypertension     Neuro/Psych PSYCHIATRIC DISORDERS Anxiety    GI/Hepatic negative GI ROS,   Endo/Other    Renal/GU   negative genitourinary   Musculoskeletal   Abdominal   Peds  Hematology negative hematology ROS (+)   Anesthesia Other Findings Past Medical History: No date: Acne No date: ADD (attention deficit disorder) No date: ADHD (attention deficit hyperactivity disorder)     Comment:  history of No date: Anxiety     Comment:  history of No date: Constipation No date: Immunization, viral disease     Comment:  GARDASIL COMPLETED No date: Personal history of kidney stones No date: Pregnancy-induced hypertension in third trimester  Past Surgical History: No date: EXCISION OF KELOID 2012: EXTERNAL EAR SURGERY; Bilateral     Comment:  NODULES REMOVED  2015: TONSILLECTOMY No date: WISDOM TOOTH EXTRACTION  BMI    Body Mass Index: 29.48 kg/m      Reproductive/Obstetrics (+) Pregnancy                             Anesthesia Physical Anesthesia Plan  ASA: II  Anesthesia Plan: Epidural   Post-op Pain Management:    Induction:   PONV Risk Score and Plan:   Airway Management Planned:   Additional Equipment:   Intra-op Plan:   Post-operative Plan:   Informed Consent: I have reviewed the patients History and Physical, chart, labs and discussed the procedure including the risks, benefits and alternatives for the proposed anesthesia with the patient or authorized representative who has indicated his/her understanding and acceptance.        Plan Discussed with: Anesthesiologist  Anesthesia Plan Comments:         Anesthesia Quick Evaluation

## 2019-08-26 ENCOUNTER — Encounter: Payer: 59 | Admitting: Obstetrics and Gynecology

## 2019-08-26 ENCOUNTER — Encounter: Payer: Self-pay | Admitting: Obstetrics and Gynecology

## 2019-08-26 DIAGNOSIS — O4593 Premature separation of placenta, unspecified, third trimester: Principal | ICD-10-CM

## 2019-08-26 DIAGNOSIS — Z3A37 37 weeks gestation of pregnancy: Secondary | ICD-10-CM

## 2019-08-26 DIAGNOSIS — O4693 Antepartum hemorrhage, unspecified, third trimester: Secondary | ICD-10-CM | POA: Diagnosis not present

## 2019-08-26 LAB — CBC
HCT: 33.3 % — ABNORMAL LOW (ref 36.0–46.0)
Hemoglobin: 11.4 g/dL — ABNORMAL LOW (ref 12.0–15.0)
MCH: 30.7 pg (ref 26.0–34.0)
MCHC: 34.2 g/dL (ref 30.0–36.0)
MCV: 89.8 fL (ref 80.0–100.0)
Platelets: 175 10*3/uL (ref 150–400)
RBC: 3.71 MIL/uL — ABNORMAL LOW (ref 3.87–5.11)
RDW: 12.4 % (ref 11.5–15.5)
WBC: 14 10*3/uL — ABNORMAL HIGH (ref 4.0–10.5)
nRBC: 0 % (ref 0.0–0.2)

## 2019-08-26 LAB — RPR: RPR Ser Ql: NONREACTIVE

## 2019-08-26 MED ORDER — SENNOSIDES-DOCUSATE SODIUM 8.6-50 MG PO TABS
2.0000 | ORAL_TABLET | ORAL | Status: DC
  Administered 2019-08-27: 2 via ORAL
  Filled 2019-08-26: qty 2

## 2019-08-26 MED ORDER — ACETAMINOPHEN 325 MG PO TABS
650.0000 mg | ORAL_TABLET | ORAL | Status: DC | PRN
  Administered 2019-08-26 – 2019-08-27 (×3): 650 mg via ORAL
  Filled 2019-08-26 (×3): qty 2

## 2019-08-26 MED ORDER — ONDANSETRON HCL 4 MG/2ML IJ SOLN: 4.0000 mg | INTRAMUSCULAR | Status: DC | PRN

## 2019-08-26 MED ORDER — WITCH HAZEL-GLYCERIN EX PADS: 1.0000 "application " | MEDICATED_PAD | CUTANEOUS | Status: DC | PRN

## 2019-08-26 MED ORDER — DIPHENHYDRAMINE HCL 25 MG PO CAPS: 25.0000 mg | ORAL_CAPSULE | Freq: Four times a day (QID) | ORAL | Status: DC | PRN

## 2019-08-26 MED ORDER — SIMETHICONE 80 MG PO CHEW: 80.0000 mg | CHEWABLE_TABLET | ORAL | Status: DC | PRN

## 2019-08-26 MED ORDER — COCONUT OIL OIL
1.0000 "application " | TOPICAL_OIL | Status: DC | PRN
  Filled 2019-08-26: qty 120

## 2019-08-26 MED ORDER — IBUPROFEN 600 MG PO TABS
600.0000 mg | ORAL_TABLET | Freq: Four times a day (QID) | ORAL | Status: DC
  Administered 2019-08-26 – 2019-08-27 (×5): 600 mg via ORAL
  Filled 2019-08-26 (×5): qty 1

## 2019-08-26 MED ORDER — PRENATAL MULTIVITAMIN CH
1.0000 | ORAL_TABLET | Freq: Every day | ORAL | Status: DC
  Administered 2019-08-26: 1 via ORAL
  Filled 2019-08-26: qty 1

## 2019-08-26 MED ORDER — ONDANSETRON HCL 4 MG PO TABS: 4.0000 mg | ORAL_TABLET | ORAL | Status: DC | PRN

## 2019-08-26 MED ORDER — FERROUS SULFATE 325 (65 FE) MG PO TABS
325.0000 mg | ORAL_TABLET | Freq: Two times a day (BID) | ORAL | Status: DC
  Administered 2019-08-26 – 2019-08-27 (×3): 325 mg via ORAL
  Filled 2019-08-26 (×3): qty 1

## 2019-08-26 MED ORDER — DIBUCAINE (PERIANAL) 1 % EX OINT: 1.0000 "application " | TOPICAL_OINTMENT | CUTANEOUS | Status: DC | PRN

## 2019-08-26 MED ORDER — BENZOCAINE-MENTHOL 20-0.5 % EX AERO
1.0000 "application " | INHALATION_SPRAY | CUTANEOUS | Status: DC | PRN
  Filled 2019-08-26 (×2): qty 56

## 2019-08-26 NOTE — Discharge Summary (Signed)
Postpartum Discharge Summary  Patient Name: Dorothy Townsend DOB: 03-Dec-1990 MRN: [id number]  Date of admission: 08/25/2019 Delivery date:08/26/2019  Delivering provider: Prentice Docker D  Date of discharge: 08/27/2019  Admitting diagnosis: Labor and delivery, indication for care [O75.9] Abdominal pain during pregnancy in third trimester [O26.893, R10.9] Normal labor [O80, Z37.9] Intrauterine pregnancy: [redacted]w[redacted]d    Secondary diagnosis:  Active Problems:   Supervision of high risk pregnancy, antepartum   History of preterm delivery, currently pregnant   Placental abruption in third trimester   Labor and delivery, indication for care   Abdominal pain during pregnancy in third trimester   Normal labor  Additional problems: none    Discharge diagnosis: Term Pregnancy Delivered                                              Post partum procedures:none Augmentation: AROM and Pitocin Complications: None  Hospital course: Onset of Labor With Vaginal Delivery      29y.o. yo G2P0101 at 366w1das admitted in Latent Labor on 08/25/2019. Patient had an uncomplicated labor course as follows:  Membrane Rupture Time/Date: 10:59 PM ,08/25/2019   Delivery Method:Vaginal, Spontaneous  Episiotomy: None  Lacerations:  None  Patient had an uncomplicated postpartum course.  She is ambulating, tolerating a regular diet, passing flatus, and urinating well. Patient is discharged home in stable condition on 08/27/19.  Newborn Data: Birth date:08/26/2019  Birth time:2:43 AM  Gender:Female  Living status:Living  Apgars:8 ,9  Weight:3020 g   Magnesium Sulfate received: No BMZ received: Yes Rhophylac:No MMR:No T-DaP:Given prenatally 07/13/2019 Transfusion:No  Physical exam  Vitals:   08/26/19 1633 08/26/19 1939 08/26/19 2321 08/27/19 0730  BP: 109/60 106/64 104/71 (!) 93/57  Pulse: 79 79 79 80  Resp:  '18 18 18  ' Temp: 98.1 F (36.7 C) 98 F (36.7 C) (!) 97.5 F (36.4 C) 98.2 F (36.8 C)    TempSrc: Oral Oral Oral Oral  SpO2: 99% 99% 99% 99%  Weight:      Height:       General: alert, cooperative and no distress Lochia: appropriate Uterine Fundus: firm Incision: N/A DVT Evaluation: No evidence of DVT seen on physical exam. No cords or calf tenderness. No significant calf/ankle edema. Labs: Lab Results  Component Value Date   WBC 8.9 08/27/2019   HGB 11.2 (L) 08/27/2019   HCT 32.8 (L) 08/27/2019   MCV 89.9 08/27/2019   PLT 174 08/27/2019   CMP Latest Ref Rng & Units 08/25/2019  Glucose 70 - 99 mg/dL 81  BUN 6 - 20 mg/dL <5(L)  Creatinine 0.44 - 1.00 mg/dL 0.43(L)  Sodium 135 - 145 mmol/L 135  Potassium 3.5 - 5.1 mmol/L 4.0  Chloride 98 - 111 mmol/L 103  CO2 22 - 32 mmol/L 21(L)  Calcium 8.9 - 10.3 mg/dL 9.2  Total Protein 6.5 - 8.1 g/dL 7.0  Total Bilirubin 0.3 - 1.2 mg/dL 0.8  Alkaline Phos 38 - 126 U/L 138(H)  AST 15 - 41 U/L 19  ALT 0 - 44 U/L 10   Edinburgh Score: Edinburgh Postnatal Depression Scale Screening Tool 08/26/2019  I have been able to laugh and see the funny side of things. 0  I have looked forward with enjoyment to things. 0  I have blamed myself unnecessarily when things went wrong. 0  I have been anxious or  worried for no good reason. 1  I have felt scared or panicky for no good reason. 0  Things have been getting on top of me. 0  I have been so unhappy that I have had difficulty sleeping. 0  I have felt sad or miserable. 0  I have been so unhappy that I have been crying. 0  The thought of harming myself has occurred to me. 0  Edinburgh Postnatal Depression Scale Total 1      After visit meds:  Allergies as of 08/27/2019      Reactions   Fluvoxamine Anxiety      Medication List    TAKE these medications   acetaminophen 325 MG tablet Commonly known as: Tylenol Take 2 tablets (650 mg total) by mouth every 4 (four) hours as needed (for pain scale < 4).   ibuprofen 600 MG tablet Commonly known as: ADVIL Take 1 tablet (600  mg total) by mouth every 6 (six) hours.   PRENATAL VITAMIN PO Take by mouth.            Discharge Care Instructions  (From admission, onward)         Start     Ordered   08/27/19 0000  Discharge wound care:       Comments: Perform wound care instructions   08/27/19 1015           Discharge home in stable condition Infant Feeding: Breast Infant Disposition:home with mother Discharge instruction: per After Visit Summary and Postpartum booklet. Activity: Advance as tolerated. Pelvic rest for 6 weeks.  Diet: routine diet Anticipated Birth Control: Unsure Postpartum Appointment:2 weeks Additional Postpartum F/U: Postpartum Depression checkup Future Appointments: No future appointments. Follow up Visit:  Follow-up Information    Will Bonnet, MD. Schedule an appointment as soon as possible for a visit in 2 week(s).   Specialty: Obstetrics and Gynecology Why: Postpartum mood check (may be by telephone) Contact information: [address] [place] [address] [phone]               SIGNED: Prentice Docker, MD, [place], [facility] 08/27/2019 10:16 AM

## 2019-08-26 NOTE — Lactation Note (Signed)
This note was copied from a baby's chart. Lactation Consultation Note  Patient Name: Dorothy Townsend Today's Date: 08/26/2019 Reason for consult: Initial assessment;Early term 37-38.6wks   Maternal Data Formula Feeding for Exclusion: No Has patient been taught Hand Expression?: Yes Does the patient have breastfeeding experience prior to this delivery?: Yes Mom has sl nipple tenderness, coconut oil given Feeding Feeding Type: Breast Fed Baby rooting, latched easily to breast, needs occ stim to continue to suck, some audible swallows, some quiet LATCH Score Latch: Grasps breast easily, tongue down, lips flanged, rhythmical sucking.  Audible Swallowing: Spontaneous and intermittent  Type of Nipple: Everted at rest and after stimulation  Comfort (Breast/Nipple): Soft / non-tender, sl tenderness  Hold (Positioning): Assistance needed to correctly position infant at breast and maintain latch.  LATCH Score: 9  Interventions Interventions: Breast feeding basics reviewed;Assisted with latch;Skin to skin;Hand express;Adjust position;Support pillows;Coconut oil  Lactation Tools Discussed/Used WIC Program: No LC name and no written on white board  Consult Status Consult Status: Follow-up Date: 08/27/19 Follow-up type: In-patient    Dyann Kief 08/26/2019, 4:14 PM

## 2019-08-27 LAB — CBC
HCT: 32.8 % — ABNORMAL LOW (ref 36.0–46.0)
Hemoglobin: 11.2 g/dL — ABNORMAL LOW (ref 12.0–15.0)
MCH: 30.7 pg (ref 26.0–34.0)
MCHC: 34.1 g/dL (ref 30.0–36.0)
MCV: 89.9 fL (ref 80.0–100.0)
Platelets: 174 10*3/uL (ref 150–400)
RBC: 3.65 MIL/uL — ABNORMAL LOW (ref 3.87–5.11)
RDW: 12.6 % (ref 11.5–15.5)
WBC: 8.9 10*3/uL (ref 4.0–10.5)
nRBC: 0 % (ref 0.0–0.2)

## 2019-08-27 MED ORDER — IBUPROFEN 600 MG PO TABS: 600.0000 mg | ORAL_TABLET | Freq: Four times a day (QID) | ORAL | 0 refills | Status: AC

## 2019-08-27 MED ORDER — ACETAMINOPHEN 325 MG PO TABS: 650.0000 mg | ORAL_TABLET | ORAL | Status: AC | PRN

## 2019-08-27 NOTE — Plan of Care (Signed)
Mother and baby bonding well. Patient recovering well postpartum and pain being managed. Discharge instructions reviewed with patient and patient verbalized understanding. RN answered all questions.

## 2019-08-27 NOTE — Discharge Instructions (Signed)

## 2019-09-05 NOTE — Anesthesia Postprocedure Evaluation (Signed)
Anesthesia Post Note  Patient: Dorothy Townsend  Procedure(s) Performed: AN AD HOC LABOR EPIDURAL  Anesthesia Type: Epidural   No complications documented.   Last Vitals: There were no vitals filed for this visit.  Last Pain: There were no vitals filed for this visit.               Karleen Hampshire

## 2019-09-14 ENCOUNTER — Encounter: Payer: Self-pay | Admitting: Obstetrics and Gynecology

## 2019-09-14 ENCOUNTER — Ambulatory Visit (INDEPENDENT_AMBULATORY_CARE_PROVIDER_SITE_OTHER): Payer: 59 | Admitting: Obstetrics and Gynecology

## 2019-09-14 ENCOUNTER — Other Ambulatory Visit: Payer: Self-pay

## 2019-09-14 ENCOUNTER — Telehealth: Payer: Self-pay | Admitting: Obstetrics and Gynecology

## 2019-09-14 NOTE — Telephone Encounter (Signed)
Patient scheduled 10/4 for Liletta insertion with SDJ KP.

## 2019-09-14 NOTE — Progress Notes (Signed)
Obstetrics & Gynecology Office Visit   Chief Complaint  Patient presents with  . Postpartum Care  . Follow-up  Screening for anxiety/depression  History of Present Illness: 29 y.o. G26P1102 female who is about 2 weeks postpartum from a vaginal delivery, presents for a routine anxiety/depression screen due to a history of anxiety.  She states that she is doing well from a mood standpoint.  Denies significant depression and anxiety.  Still with consistent lochia. No breast concerns. She is interested in the IUD for contraception.    Past Medical History:  Diagnosis Date  . Acne   . ADD (attention deficit disorder)   . ADHD (attention deficit hyperactivity disorder)    history of  . Anxiety    history of  . Constipation   . Immunization, viral disease    GARDASIL COMPLETED  . Personal history of kidney stones   . Pregnancy-induced hypertension in third trimester     Past Surgical History:  Procedure Laterality Date  . EXCISION OF KELOID    . EXTERNAL EAR SURGERY Bilateral 2012   NODULES REMOVED   . TONSILLECTOMY  2015  . WISDOM TOOTH EXTRACTION      Gynecologic History: No LMP recorded.  Obstetric History: [id number]  Family History  Problem Relation Age of Onset  . Diabetes Maternal Grandfather   . Cancer Other        UNKNOWN AGE/TYPE-THINKS THROAT    Social History   Socioeconomic History  . Marital status: Married    Spouse name: Not on file  . Number of children: 0  . Years of education: 10  . Highest education level: Not on file  Occupational History  . Occupation: Stylist    Comment: [employer]  Tobacco Use  . Smoking status: Never Smoker  . Smokeless tobacco: Never Used  Vaping Use  . Vaping Use: Never used  Substance and Sexual Activity  . Alcohol use: Yes    Alcohol/week: 0.0 standard drinks    Comment: seldom  . Drug use: No  . Sexual activity: Yes    Partners: Male    Birth control/protection: None  Other Topics Concern  . Not on  file  Social History Narrative  . Not on file   Social Determinants of Health   Financial Resource Strain:   . Difficulty of Paying Living Expenses: Not on file  Food Insecurity:   . Worried About Programme researcher, broadcasting/film/video in the Last Year: Not on file  . Ran Out of Food in the Last Year: Not on file  Transportation Needs:   . Lack of Transportation (Medical): Not on file  . Lack of Transportation (Non-Medical): Not on file  Physical Activity:   . Days of Exercise per Week: Not on file  . Minutes of Exercise per Session: Not on file  Stress:   . Feeling of Stress : Not on file  Social Connections:   . Frequency of Communication with Friends and Family: Not on file  . Frequency of Social Gatherings with Friends and Family: Not on file  . Attends Religious Services: Not on file  . Active Member of Clubs or Organizations: Not on file  . Attends Banker Meetings: Not on file  . Marital Status: Not on file  Intimate Partner Violence:   . Fear of Current or Ex-Partner: Not on file  . Emotionally Abused: Not on file  . Physically Abused: Not on file  . Sexually Abused: Not on file    Allergies  Allergen Reactions  . Fluvoxamine Anxiety    Prior to Admission medications   Medication Sig Start Date End Date Taking? Authorizing Provider  acetaminophen (TYLENOL) 325 MG tablet Take 2 tablets (650 mg total) by mouth every 4 (four) hours as needed (for pain scale < 4). 08/27/19   Conard Novak, MD  ibuprofen (ADVIL) 600 MG tablet Take 1 tablet (600 mg total) by mouth every 6 (six) hours. 08/27/19   Conard Novak, MD  Prenatal Vit-Fe Fumarate-FA (PRENATAL VITAMIN PO) Take by mouth.    [provider]    Review of Systems  Constitutional: Negative.   HENT: Negative.   Eyes: Negative.   Respiratory: Negative.   Cardiovascular: Negative.   Gastrointestinal: Negative.   Genitourinary: Negative.   Musculoskeletal: Negative.   Skin: Negative.   Neurological:  Negative.   Psychiatric/Behavioral: Negative.      Physical Exam BP 122/70   Wt 142 lb (64.4 kg)   BMI 26.83 kg/m  No LMP recorded. Physical Exam Constitutional:      General: She is not in acute distress.    Appearance: Normal appearance.  HENT:     Head: Normocephalic and atraumatic.  Eyes:     General: No scleral icterus.    Conjunctiva/sclera: Conjunctivae normal.  Neurological:     General: No focal deficit present.     Mental Status: She is alert and oriented to person, place, and time.     Cranial Nerves: No cranial nerve deficit.  Psychiatric:        Mood and Affect: Mood normal.        Behavior: Behavior normal.        Judgment: Judgment normal.     Female chaperone present for pelvic and breast  portions of the physical exam   Edinburgh Postnatal Depression Scale - 09/14/19 1751      Edinburgh Postnatal Depression Scale:  In the Past 7 Days   I have been able to laugh and see the funny side of things. 0    I have looked forward with enjoyment to things. 0    I have blamed myself unnecessarily when things went wrong. 1    I have been anxious or worried for no good reason. 0    I have felt scared or panicky for no good reason. 1    Things have been getting on top of me. 0    I have been so unhappy that I have had difficulty sleeping. 0    I have felt sad or miserable. 0    I have been so unhappy that I have been crying. 0    The thought of harming myself has occurred to me. 0    Edinburgh Postnatal Depression Scale Total 2            Assessment: 29 y.o. Q7Y1950 female here for  1. Postpartum care and examination      Plan: Problem List Items Addressed This Visit    None    Visit Diagnoses    Postpartum care and examination    -  Primary     No evidence of depression or anxiety. Long discussion regarding copper vs levonorgestrel IUD.  At the moment she is leaning toward Liletta.  Return in about 4 weeks (around 10/12/2019) for Six Week Postpartum  w Liletta insertion.   [name], MD 09/14/2019 5:52 PM

## 2019-09-15 NOTE — Telephone Encounter (Signed)
Noted. Will order to arrive by apt date/time. 

## 2019-10-17 ENCOUNTER — Encounter: Payer: Self-pay | Admitting: Obstetrics and Gynecology

## 2019-10-17 ENCOUNTER — Other Ambulatory Visit: Payer: Self-pay

## 2019-10-17 ENCOUNTER — Ambulatory Visit (INDEPENDENT_AMBULATORY_CARE_PROVIDER_SITE_OTHER): Payer: 59 | Admitting: Obstetrics and Gynecology

## 2019-10-17 DIAGNOSIS — Z30430 Encounter for insertion of intrauterine contraceptive device: Secondary | ICD-10-CM

## 2019-10-17 LAB — POCT URINE PREGNANCY: Preg Test, Ur: NEGATIVE

## 2019-10-17 MED ORDER — PARAGARD INTRAUTERINE COPPER IU IUD: 1.0000 | INTRAUTERINE_SYSTEM | Freq: Once | INTRAUTERINE | 0 refills | Status: AC

## 2019-10-17 NOTE — Progress Notes (Signed)
Postpartum Visit  Chief Complaint:  Chief Complaint  Patient presents with  . Postpartum Care    History of Present Illness: Patient is a 30 y.o. V2Z3664 presents for postpartum visit.  Date of delivery: 08/26/2019 Type of delivery: Vaginal delivery - Vacuum or forceps assisted  : no Episiotomy No.  Laceration: No Pregnancy or labor problems:  Pre-term labor Any problems since the delivery:  No  Newborn Details:  SINGLETON :  1. Baby's name: Emersyn. Birth weight:  Maternal Details:  Breast Feeding: YES Post partum depression/anxiety noted:  No Edinburgh Post-Partum Depression Score:  1  Date of last PAP: 01/30/2018-NORMAL  Past Medical History:  Diagnosis Date  . Acne   . ADD (attention deficit disorder)   . ADHD (attention deficit hyperactivity disorder)    history of  . Anxiety    history of  . Constipation   . Immunization, viral disease    GARDASIL COMPLETED  . Personal history of kidney stones   . Pregnancy-induced hypertension in third trimester     Past Surgical History:  Procedure Laterality Date  . EXCISION OF KELOID    . EXTERNAL EAR SURGERY Bilateral 2012   NODULES REMOVED   . TONSILLECTOMY  2015  . WISDOM TOOTH EXTRACTION      Prior to Admission medications   Medication Sig Start Date End Date Taking? Authorizing Provider  Prenatal Vit-Fe Fumarate-FA (PRENATAL VITAMIN PO) Take by mouth.   Yes [provider]    Allergies  Allergen Reactions  . Fluvoxamine Anxiety     Social History   Socioeconomic History  . Marital status: Married    Spouse name: Not on file  . Number of children: 0  . Years of education: 2  . Highest education level: Not on file  Occupational History  . Occupation: Stylist    Comment: [employer]  Tobacco Use  . Smoking status: Never Smoker  . Smokeless tobacco: Never Used  Vaping Use  . Vaping Use: Never used  Substance and Sexual Activity  . Alcohol use: Yes    Alcohol/week: 0.0 standard drinks     Comment: seldom  . Drug use: No  . Sexual activity: Yes    Partners: Male    Birth control/protection: None  Other Topics Concern  . Not on file  Social History Narrative  . Not on file   Social Determinants of Health   Financial Resource Strain:   . Difficulty of Paying Living Expenses: Not on file  Food Insecurity:   . Worried About Programme researcher, broadcasting/film/video in the Last Year: Not on file  . Ran Out of Food in the Last Year: Not on file  Transportation Needs:   . Lack of Transportation (Medical): Not on file  . Lack of Transportation (Non-Medical): Not on file  Physical Activity:   . Days of Exercise per Week: Not on file  . Minutes of Exercise per Session: Not on file  Stress:   . Feeling of Stress : Not on file  Social Connections:   . Frequency of Communication with Friends and Family: Not on file  . Frequency of Social Gatherings with Friends and Family: Not on file  . Attends Religious Services: Not on file  . Active Member of Clubs or Organizations: Not on file  . Attends Banker Meetings: Not on file  . Marital Status: Not on file  Intimate Partner Violence:   . Fear of Current or Ex-Partner: Not on file  . Emotionally Abused:  Not on file  . Physically Abused: Not on file  . Sexually Abused: Not on file    Family History  Problem Relation Age of Onset  . Diabetes Maternal Grandfather   . Cancer Other        UNKNOWN AGE/TYPE-THINKS THROAT    Review of Systems  Constitutional: Negative.   HENT: Negative.   Eyes: Negative.   Respiratory: Negative.   Cardiovascular: Negative.   Gastrointestinal: Negative.   Genitourinary: Negative.   Musculoskeletal: Negative.   Skin: Negative.   Neurological: Negative.   Psychiatric/Behavioral: Negative.      Physical Exam BP 122/70   Ht 5\' 1"  (1.549 m)   Wt 139 lb (63 kg)   LMP 10/10/2019   BMI 26.26 kg/m   Physical Exam Constitutional:      General: She is not in acute distress.    Appearance:  Normal appearance. She is well-developed.  Genitourinary:     Pelvic exam was performed with patient in the lithotomy position.     Vulva, inguinal canal, urethra, bladder, vagina, uterus, right adnexa and left adnexa normal.     No posterior fourchette tenderness, injury or lesion present.     No cervical friability, lesion, bleeding or polyp.  HENT:     Head: Normocephalic and atraumatic.  Eyes:     General: No scleral icterus.    Conjunctiva/sclera: Conjunctivae normal.  Cardiovascular:     Rate and Rhythm: Normal rate and regular rhythm.     Heart sounds: No murmur heard.  No friction rub. No gallop.   Pulmonary:     Effort: Pulmonary effort is normal. No respiratory distress.     Breath sounds: Normal breath sounds. No wheezing or rales.  Abdominal:     General: Bowel sounds are normal. There is no distension.     Palpations: Abdomen is soft. There is no mass.     Tenderness: There is no abdominal tenderness. There is no guarding or rebound.  Musculoskeletal:        General: Normal range of motion.     Cervical back: Normal range of motion and neck supple.  Neurological:     General: No focal deficit present.     Mental Status: She is alert and oriented to person, place, and time.     Cranial Nerves: No cranial nerve deficit.  Skin:    General: Skin is warm and dry.     Findings: No erythema.  Psychiatric:        Mood and Affect: Mood normal.        Behavior: Behavior normal.        Judgment: Judgment normal.   UPT: negative   IUD Insertion Procedure Note (Paragard) Patient identified, informed consent performed, consent signed.   Discussed risks of irregular bleeding, cramping, infection, malpositioning, expulsion or uterine perforation of the IUD (1:1000 placements)  which may require further procedure such as laparoscopy.  IUD while effective at preventing pregnancy do not prevent transmission of sexually transmitted diseases and use of barrier methods for this purpose  was discussed. Time out was performed.  Urine pregnancy test negative.  Speculum placed in the vagina.  Cervix visualized.  Cleaned with Betadine x 2.  Grasped anteriorly with a single tooth tenaculum.  Uterus sounded to 8 cm. IUD placed per manufacturer's recommendations.  Strings trimmed to 3 cm. Tenaculum was removed, good hemostasis noted.  Patient tolerated procedure well.   Patient was given post-procedure instructions.  She was advised to have backup  contraception for one week.  Patient was also asked to check IUD strings periodically and follow up in 4 weeks for IUD check.   Female Chaperone present during breast and/or pelvic exam.  Assessment: 29 y.o. G2I9485 presenting for 6 week postpartum visit  Plan: Problem List Items Addressed This Visit    None    Visit Diagnoses    Postpartum care and examination    -  Primary   Relevant Medications   paragard intrauterine copper IUD IUD   Encounter for IUD insertion       Relevant Medications   paragard intrauterine copper IUD IUD   Other Relevant Orders   POCT urine pregnancy (Completed)      1) Contraception Education given regarding options for contraception, including IUD placement.  2)  Pap - ASCCP guidelines and rational discussed.  Patient opts for routine screening interval  3) Patient underwent screening for postpartum depression with no concerns noted.  Return in about 4 weeks (around 11/14/2019) for IUD String Check.   [name], MD 10/17/2019 11:47 AM

## 2019-11-14 ENCOUNTER — Other Ambulatory Visit: Payer: Self-pay

## 2019-11-14 ENCOUNTER — Ambulatory Visit (INDEPENDENT_AMBULATORY_CARE_PROVIDER_SITE_OTHER): Payer: 59 | Admitting: Obstetrics and Gynecology

## 2019-11-14 ENCOUNTER — Encounter: Payer: Self-pay | Admitting: Obstetrics and Gynecology

## 2019-11-14 VITALS — BP 122/74 | Ht 61.0 in | Wt 140.0 lb

## 2019-11-14 DIAGNOSIS — Z30431 Encounter for routine checking of intrauterine contraceptive device: Secondary | ICD-10-CM

## 2019-11-14 NOTE — Progress Notes (Signed)
IUD String Check (Paragard)  Subjctive: Dorothy Townsend presents for IUD string check.  Dorothy Townsend had a Paragard placed 4 weeks ago.  Since placement of her IUD Dorothy Townsend had some vaginal bleeding.  Dorothy Townsend reports mild cramping or discomfort.  Dorothy Townsend has had intercourse since placement.  Dorothy Townsend has not checked the strings.  Dorothy Townsend denies any fever, chills, nausea, vomiting, or other complaints.    Objective: BP 122/74   Ht 5\' 1"  (1.549 m)   Wt 140 lb (63.5 kg)   BMI 26.45 kg/m  Physical Exam Constitutional:      General: Dorothy Townsend is not in acute distress.    Appearance: Normal appearance. Dorothy Townsend is well-developed.  Genitourinary:     Pelvic exam was performed with patient in the lithotomy position.     Vulva, inguinal canal, urethra, bladder, vagina, uterus, right adnexa and left adnexa normal.     No posterior fourchette tenderness, injury or lesion present.     No cervical friability, lesion, bleeding or polyp.     IUD strings visualized.  HENT:     Head: Normocephalic and atraumatic.  Eyes:     General: No scleral icterus.    Conjunctiva/sclera: Conjunctivae normal.  Cardiovascular:     Rate and Rhythm: Normal rate and regular rhythm.     Heart sounds: No murmur heard.  No friction rub. No gallop.   Pulmonary:     Effort: Pulmonary effort is normal. No respiratory distress.     Breath sounds: Normal breath sounds. No wheezing or rales.  Abdominal:     General: Bowel sounds are normal. There is no distension.     Palpations: Abdomen is soft. There is no mass.     Tenderness: There is no abdominal tenderness. There is no guarding or rebound.  Musculoskeletal:        General: Normal range of motion.     Cervical back: Normal range of motion and neck supple.  Neurological:     General: No focal deficit present.     Mental Status: Dorothy Townsend is alert and oriented to person, place, and time.     Cranial Nerves: No cranial nerve deficit.  Skin:    General: Skin is warm and dry.     Findings: No  erythema.  Psychiatric:        Mood and Affect: Mood normal.        Behavior: Behavior normal.        Judgment: Judgment normal.      Female chaperone was present for the entirety of the pelvic exam  Assessment: 29 y.o. year old female status post prior Paragard IUD placement 4 week ago, doing well.  Plan: 1.  The patient was given instructions to check her IUD strings monthly and call with any problems or concerns.  Dorothy Townsend should call for fevers, chills, abnormal vaginal discharge, pelvic pain, or other complaints. 2.  Dorothy Townsend will return for a annual exam in 1 year.  All questions answered.  20 minutes spent in face to face discussion with > 50% spent in counseling, management, and coordination of care for her newly-placed IUD.  Risks and benefits of IUD discussed including the risks of irregular bleeding, cramping, infection, malpositioning, expulsion, which may require further procedures such as laparoscopy.  IUDs while effective at preventing pregnancy do not prevent transmission of sexually transmitted diseases and use of barrier methods for this purpose was discussed.  Low overall incidence of failure with 99.7% efficacy rate in typical use.  [name], MD 11/14/2019 12:23 PM

## 2020-02-06 ENCOUNTER — Telehealth: Payer: Self-pay

## 2020-02-06 NOTE — Telephone Encounter (Signed)
Please advise 

## 2020-02-06 NOTE — Telephone Encounter (Signed)
Copied from CRM [id number]. Topic: Quick Communication - See Telephone Encounter >> Feb 06, 2020 10:30 AM Aretta Nip wrote: CRM for notification. See Telephone encounter for: 02/06/20. Pt states her family goes to Dr [facility] L[facility] . Her husband is Rodena Goldmann. She is wanting to get a new Pt appt with Dr Sullivan Lone. Please CB at [phone]  pt was offered an appt by another provider but no appt available till April. CB

## 2020-02-07 ENCOUNTER — Other Ambulatory Visit: Payer: Self-pay

## 2020-02-07 ENCOUNTER — Ambulatory Visit (INDEPENDENT_AMBULATORY_CARE_PROVIDER_SITE_OTHER): Payer: 59 | Admitting: Obstetrics and Gynecology

## 2020-02-07 VITALS — BP 122/74 | Ht 61.0 in | Wt 126.0 lb

## 2020-02-07 DIAGNOSIS — Z30432 Encounter for removal of intrauterine contraceptive device: Secondary | ICD-10-CM | POA: Diagnosis not present

## 2020-02-07 DIAGNOSIS — T8332XA Displacement of intrauterine contraceptive device, initial encounter: Secondary | ICD-10-CM | POA: Diagnosis not present

## 2020-02-07 DIAGNOSIS — F419 Anxiety disorder, unspecified: Secondary | ICD-10-CM | POA: Diagnosis not present

## 2020-02-07 MED ORDER — HYDROXYZINE HCL 25 MG PO TABS: 25.0000 mg | ORAL_TABLET | Freq: Four times a day (QID) | ORAL | 1 refills | Status: AC | PRN

## 2020-02-07 NOTE — Progress Notes (Signed)
   IUD String Check  Subjctive: [name] presents for IUD string check.  She had a Paragard placed 4 months ago.  Since placement of her IUD she had frequent vaginal bleeding.  She reports cramping or discomfort.  She has had intercourse since placement.  She has not checked the strings.  She denies any fever, chills, nausea, vomiting, or other complaints.    Objective: BP 122/74   Ht 5\' 1"  (1.549 m)   Wt 126 lb (57.2 kg)   BMI 23.81 kg/m  Physical Exam Constitutional:      General: She is not in acute distress.    Appearance: Normal appearance.  Genitourinary:     No lesions in the vagina.     Right Labia: No rash, tenderness, lesions or skin changes.    Left Labia: No tenderness, lesions, skin changes or rash.    No vaginal discharge, tenderness or bleeding.      Right Adnexa: not tender, not full and no mass present.    Left Adnexa: not tender, not full and no mass present.    No cervical motion tenderness, friability or lesion.     IUD strings visualized.  HENT:     Head: Normocephalic and atraumatic.  Eyes:     General: No scleral icterus.    Conjunctiva/sclera: Conjunctivae normal.  Neurological:     General: No focal deficit present.     Mental Status: She is alert and oriented to person, place, and time.     Cranial Nerves: No cranial nerve deficit.  Psychiatric:        Mood and Affect: Mood normal.        Behavior: Behavior normal.        Judgment: Judgment normal.    Bedside ultrasound performed shows the IUD at the fundus. One arm of the IUD appears to be at an odd angle.  No other obvious abnormal findings.  IUD Removal  Patient identified, informed consent performed, consent signed.  Patient was in the dorsal lithotomy position, normal external genitalia was noted.  A speculum was placed in the patient's vagina, normal discharge was noted, no lesions. The cervix was visualized, no lesions, no abnormal discharge.  The strings of the IUD were  grasped and pulled using ring forceps. The IUD was removed in its entirety. Patient tolerated the procedure well.    Patient will use progesterone only pills for contraception.  Female chaperone was present for the entirety of the pelvic exam  Assessment: 30 y.o. year old female status post prior Paragard IUD placement 4 months ago, removed today due to ongoing bleeding.   Plan: Discussed that irregular bleeding could be due to breastfeeding. However, this long after delivery, she should be having more time between bleeding episodes and they shouldn't last this long. Option given to give things more time vs removal of IUD now and see if symptoms resolve. With concern on ultrasound, decision made by patient to remove IUD, which was accomplished without difficulty. She will use condoms, and Slynd for now. May consider replacement at some point.  She also noted some panic attacks due to life situational stressors.  Provided Atarax, as needed.   34, MD 02/07/2020 4:06 PM

## 2020-02-08 ENCOUNTER — Encounter: Payer: Self-pay | Admitting: Obstetrics and Gynecology

## 2020-02-09 NOTE — Telephone Encounter (Signed)
Appointment okay when 1 is available.

## 2020-02-14 NOTE — Telephone Encounter (Signed)
Please schedule new pt appt whenever 1st available. Thanks!

## 2020-03-12 NOTE — Telephone Encounter (Signed)
Pt called to schd an appt with DR. Sullivan Lone  She was told he would see hr since she sees her husband.  It would not let me schd any np appt with him.  Please ask if this is ok with the status change of the office.  Her number is [phone]

## 2020-03-13 NOTE — Telephone Encounter (Signed)
NP appt made for 03/14/20 at 8:20.  Advised to be here at 8:00 to complete NP papers.

## 2020-03-14 ENCOUNTER — Other Ambulatory Visit: Payer: Self-pay

## 2020-03-14 ENCOUNTER — Ambulatory Visit (INDEPENDENT_AMBULATORY_CARE_PROVIDER_SITE_OTHER): Payer: 59 | Admitting: Family Medicine

## 2020-03-14 ENCOUNTER — Encounter: Payer: Self-pay | Admitting: Family Medicine

## 2020-03-14 VITALS — BP 113/76 | HR 79 | Resp 16 | Ht 61.0 in | Wt 123.0 lb

## 2020-03-14 DIAGNOSIS — G5623 Lesion of ulnar nerve, bilateral upper limbs: Secondary | ICD-10-CM

## 2020-03-14 DIAGNOSIS — Z789 Other specified health status: Secondary | ICD-10-CM | POA: Diagnosis not present

## 2020-03-14 NOTE — Progress Notes (Signed)
New patient visit   Patient: Dorothy Townsend   DOB: September 24, 1990   30 y.o. Female  MRN: [id number] Visit Date: 03/14/2020  Today's healthcare provider: Megan Mans, MD   Chief Complaint  Patient presents with  . New Patient (Initial Visit)   Subjective    Dorothy Townsend is a 30 y.o. female who presents today as a new patient to establish care.  She is married and the mother of a 62-year-old son and a 30-month-old daughter.  She is still breast-feeding.  She has had an IUD removed because of bleeding, she is on no birth control. On review of systems her only issue is 1 of occasional tingling and numbness in both pinky fingers. Rarely she feels as though her body has a tingle to it.  Past Medical History:  Diagnosis Date  . Acne   . ADD (attention deficit disorder)   . ADHD (attention deficit hyperactivity disorder)    history of  . Anxiety    history of  . Constipation   . Immunization, viral disease    GARDASIL COMPLETED  . Personal history of kidney stones   . Pregnancy-induced hypertension in third trimester    Past Surgical History:  Procedure Laterality Date  . EXCISION OF KELOID    . EXTERNAL EAR SURGERY Bilateral 2012   NODULES REMOVED   . TONSILLECTOMY  2015  . WISDOM TOOTH EXTRACTION     Family Status  Relation Name Status  . Mother  Alive       healthy  . Father  Deceased       healthy  . Sister  Alive       healthy  . Brother  Alive       healthy  . MGF  Alive  . Other [facility] Alive   Family History  Problem Relation Age of Onset  . Heart disease Father   . Diabetes Maternal Grandfather   . Heart disease Maternal Grandfather   . Cancer Other        UNKNOWN AGE/TYPE-THINKS THROAT   Social History   Socioeconomic History  . Marital status: Married    Spouse name: Not on file  . Number of children: 0  . Years of education: 73  . Highest education level: Not on file  Occupational History  . Occupation: Stylist    Comment: [employer]  Tobacco Use  . Smoking status: Never Smoker  . Smokeless tobacco: Never Used  Vaping Use  . Vaping Use: Never used  Substance and Sexual Activity  . Alcohol use: Yes    Alcohol/week: 0.0 standard drinks    Comment: seldom  . Drug use: No  . Sexual activity: Yes    Partners: Male    Birth control/protection: None  Other Topics Concern  . Not on file  Social History Narrative  . Not on file   Social Determinants of Health   Financial Resource Strain: Not on file  Food Insecurity: Not on file  Transportation Needs: Not on file  Physical Activity: Not on file  Stress: Not on file  Social Connections: Not on file   Outpatient Medications Prior to Visit  Medication Sig  . Prenatal Vit-Fe Fumarate-FA (PRENATAL VITAMIN PO) Take by mouth.  Marland Kitchen acetaminophen (TYLENOL) 325 MG tablet Take 2 tablets (650 mg total) by mouth every 4 (four) hours as needed (for pain scale < 4). (Patient not taking: No sig reported)  . hydrOXYzine (ATARAX/VISTARIL) 25 MG tablet Take 1  tablet (25 mg total) by mouth every 6 (six) hours as needed for anxiety. (Patient not taking: Reported on 03/14/2020)  . ibuprofen (ADVIL) 600 MG tablet Take 1 tablet (600 mg total) by mouth every 6 (six) hours. (Patient not taking: Reported on 02/07/2020)  . paragard intrauterine copper IUD IUD 1 Intra Uterine Device (1 each total) by Intrauterine route once for 1 dose.   No facility-administered medications prior to visit.   Allergies  Allergen Reactions  . Fluvoxamine Anxiety    Immunization History  Administered Date(s) Administered  . Hpv-Unspecified 08/14/2006, 10/14/2006, 02/14/2007  . Influenza,inj,Quad PF,6+ Mos 12/01/2018  . Tdap 03/02/2017, 07/13/2019    Health Maintenance  Topic Date Due  . Hepatitis C Screening  Never done  . COVID-19 Vaccine (1) Never done  . INFLUENZA VACCINE  08/14/2019  . PAP-Cervical Cytology Screening  11/30/2021  . PAP SMEAR-Modifier  11/30/2021  . TETANUS/TDAP  07/12/2029   . HPV VACCINES  Completed  . HIV Screening  Completed    Patient Care Team: Maple Hudson., MD as PCP - General (Family Medicine)  Review of Systems  All other systems reviewed and are negative.      Objective    BP 113/76   Pulse 79   Resp 16   Ht 5\' 1"  (1.549 m)   Wt 123 lb (55.8 kg)   BMI 23.24 kg/m  Physical Exam Vitals reviewed.  Constitutional:      Appearance: Normal appearance.  HENT:     Head: Normocephalic and atraumatic.     Right Ear: External ear normal.     Left Ear: External ear normal.     Nose: Nose normal.  Eyes:     General: No scleral icterus.    Conjunctiva/sclera: Conjunctivae normal.  Cardiovascular:     Rate and Rhythm: Normal rate and regular rhythm.     Pulses: Normal pulses.     Heart sounds: Normal heart sounds.  Pulmonary:     Effort: Pulmonary effort is normal.     Breath sounds: Normal breath sounds.  Abdominal:     Palpations: Abdomen is soft.  Lymphadenopathy:     Cervical: No cervical adenopathy.  Skin:    General: Skin is warm and dry.  Neurological:     General: No focal deficit present.     Mental Status: She is alert and oriented to person, place, and time.     Comments: Sensory exam of the upper extremities is normal as is strength.  Psychiatric:        Mood and Affect: Mood normal.        Behavior: Behavior normal.        Thought Content: Thought content normal.        Judgment: Judgment normal.      Depression Screen PHQ 2/9 Scores 12/01/2018 07/27/2014  PHQ - 2 Score 0 0  PHQ- 9 Score 2 -   No results found for any visits on 03/14/20.  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 If symptoms become worse or if systemic symptoms become an issue she will come back  2. Gravida 2 para 2 She will discuss contraception with Dr. 05/14/20 from [facility] IUD just removed. She is still breast-feeding. Briefly discussed Covid vaccine.  She wishes to wait at  this time.   No follow-ups on file.     I, [facility], MD, have reviewed all documentation for this  visit. The documentation on 03/14/20 for the exam, diagnosis, procedures, and orders are all accurate and complete.    Oluwanifemi Susman Wendelyn Breslow, MD  [facility] [phone] (phone) [phone] (fax)  [facility]

## 2020-11-09 NOTE — Telephone Encounter (Signed)
Patient decided rcvd/charged Paragard 10/17/19

## 2021-02-25 DIAGNOSIS — L309 Dermatitis, unspecified: Secondary | ICD-10-CM | POA: Diagnosis not present

## 2021-04-10 ENCOUNTER — Other Ambulatory Visit: Payer: Self-pay | Admitting: Obstetrics and Gynecology

## 2021-04-10 DIAGNOSIS — T85848A Pain due to other internal prosthetic devices, implants and grafts, initial encounter: Secondary | ICD-10-CM

## 2021-04-11 ENCOUNTER — Ambulatory Visit
Admission: RE | Admit: 2021-04-11 | Discharge: 2021-04-11 | Disposition: A | Discharge status: Home / Self Care | Payer: 59 | Source: Ambulatory Visit | Attending: Obstetrics and Gynecology | Admitting: Obstetrics and Gynecology

## 2021-04-11 DIAGNOSIS — N644 Mastodynia: Secondary | ICD-10-CM | POA: Diagnosis not present

## 2021-04-11 DIAGNOSIS — T85848A Pain due to other internal prosthetic devices, implants and grafts, initial encounter: Secondary | ICD-10-CM | POA: Diagnosis not present

## 2021-04-18 DIAGNOSIS — Z1339 Encounter for screening examination for other mental health and behavioral disorders: Secondary | ICD-10-CM | POA: Diagnosis not present

## 2021-04-18 DIAGNOSIS — Z01419 Encounter for gynecological examination (general) (routine) without abnormal findings: Secondary | ICD-10-CM | POA: Diagnosis not present

## 2021-04-18 DIAGNOSIS — Z1331 Encounter for screening for depression: Secondary | ICD-10-CM | POA: Diagnosis not present

## 2021-04-18 DIAGNOSIS — Z124 Encounter for screening for malignant neoplasm of cervix: Secondary | ICD-10-CM | POA: Diagnosis not present

## 2021-04-18 DIAGNOSIS — R69 Illness, unspecified: Secondary | ICD-10-CM | POA: Diagnosis not present

## 2021-05-24 ENCOUNTER — Other Ambulatory Visit: Payer: Self-pay | Admitting: Obstetrics and Gynecology

## 2021-05-24 DIAGNOSIS — T85848A Pain due to other internal prosthetic devices, implants and grafts, initial encounter: Secondary | ICD-10-CM

## 2021-06-03 ENCOUNTER — Other Ambulatory Visit: Payer: 59

## 2021-10-16 DIAGNOSIS — D2372 Other benign neoplasm of skin of left lower limb, including hip: Secondary | ICD-10-CM | POA: Diagnosis not present

## 2021-10-16 DIAGNOSIS — D235 Other benign neoplasm of skin of trunk: Secondary | ICD-10-CM | POA: Diagnosis not present

## 2021-10-16 DIAGNOSIS — L718 Other rosacea: Secondary | ICD-10-CM | POA: Diagnosis not present

## 2021-10-16 DIAGNOSIS — D2371 Other benign neoplasm of skin of right lower limb, including hip: Secondary | ICD-10-CM | POA: Diagnosis not present

## 2021-10-16 DIAGNOSIS — Z85828 Personal history of other malignant neoplasm of skin: Secondary | ICD-10-CM | POA: Diagnosis not present

## 2021-11-26 DIAGNOSIS — Z6823 Body mass index (BMI) 23.0-23.9, adult: Secondary | ICD-10-CM | POA: Diagnosis not present

## 2021-11-26 DIAGNOSIS — R051 Acute cough: Secondary | ICD-10-CM | POA: Diagnosis not present

## 2021-11-26 DIAGNOSIS — J209 Acute bronchitis, unspecified: Secondary | ICD-10-CM | POA: Diagnosis not present

## 2021-12-18 DIAGNOSIS — Z124 Encounter for screening for malignant neoplasm of cervix: Secondary | ICD-10-CM | POA: Diagnosis not present

## 2022-10-17 DIAGNOSIS — X32XXXA Exposure to sunlight, initial encounter: Secondary | ICD-10-CM | POA: Diagnosis not present

## 2022-10-17 DIAGNOSIS — D2272 Melanocytic nevi of left lower limb, including hip: Secondary | ICD-10-CM | POA: Diagnosis not present

## 2022-10-17 DIAGNOSIS — D2261 Melanocytic nevi of right upper limb, including shoulder: Secondary | ICD-10-CM | POA: Diagnosis not present

## 2022-10-17 DIAGNOSIS — L814 Other melanin hyperpigmentation: Secondary | ICD-10-CM | POA: Diagnosis not present

## 2022-10-17 DIAGNOSIS — D225 Melanocytic nevi of trunk: Secondary | ICD-10-CM | POA: Diagnosis not present

## 2022-10-17 DIAGNOSIS — D2262 Melanocytic nevi of left upper limb, including shoulder: Secondary | ICD-10-CM | POA: Diagnosis not present

## 2022-10-17 DIAGNOSIS — D2271 Melanocytic nevi of right lower limb, including hip: Secondary | ICD-10-CM | POA: Diagnosis not present

## 2022-10-17 DIAGNOSIS — L578 Other skin changes due to chronic exposure to nonionizing radiation: Secondary | ICD-10-CM | POA: Diagnosis not present

## 2022-10-30 ENCOUNTER — Other Ambulatory Visit: Payer: Self-pay | Admitting: Nurse Practitioner

## 2022-10-30 DIAGNOSIS — Z9882 Breast implant status: Secondary | ICD-10-CM

## 2022-11-07 ENCOUNTER — Ambulatory Visit
Admission: RE | Admit: 2022-11-07 | Discharge: 2022-11-07 | Disposition: A | Discharge status: Home / Self Care | Payer: 59 | Source: Ambulatory Visit | Attending: Nurse Practitioner | Admitting: Nurse Practitioner

## 2022-11-07 DIAGNOSIS — Z9882 Breast implant status: Secondary | ICD-10-CM | POA: Insufficient documentation

## 2022-11-07 DIAGNOSIS — N644 Mastodynia: Secondary | ICD-10-CM | POA: Diagnosis not present

## 2022-11-24 IMAGING — US US BREAST*L* LIMITED INC AXILLA
1 series · 5 of 5 positions shown · non-contrast
Comparison: None

CLINICAL DATA: Recent bilateral retropectoral silicone implant
placement in [REDACTED]. Patient is experiencing pain and a lump in
the LEFT inner breast.

EXAM:
ULTRASOUND OF THE LEFT BREAST

[Series 1: us breast*left* limited inc axilla · 0.04mm/px · 5 of 5 slices shown]
[im 1/5]
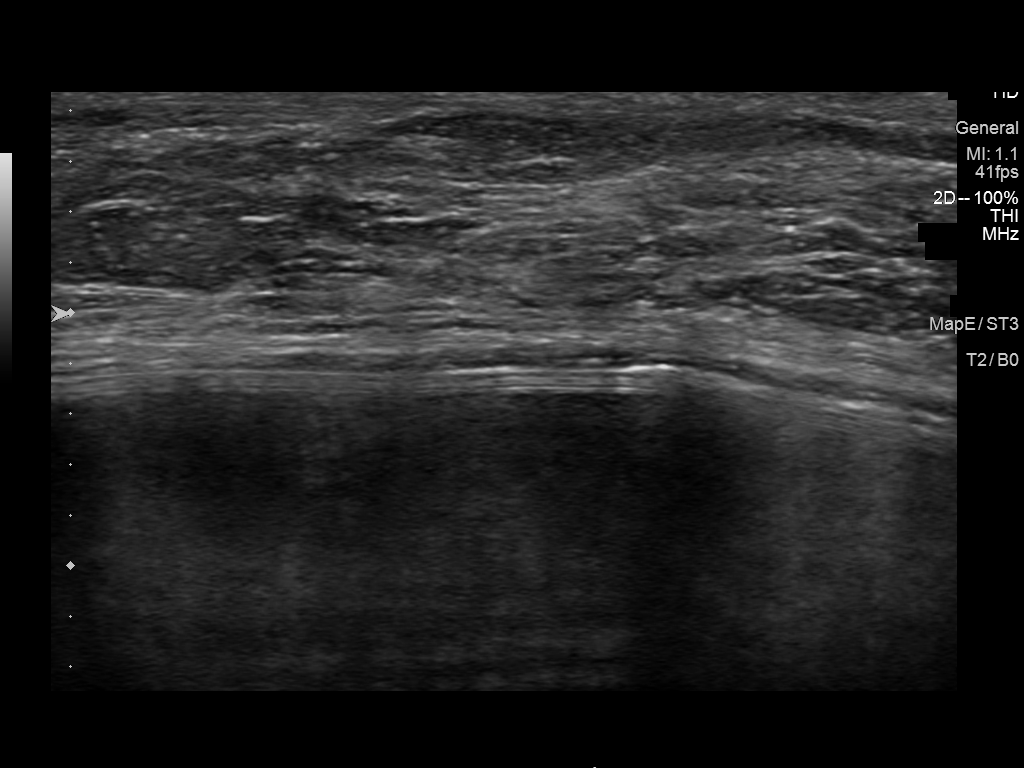
[im 2/5]
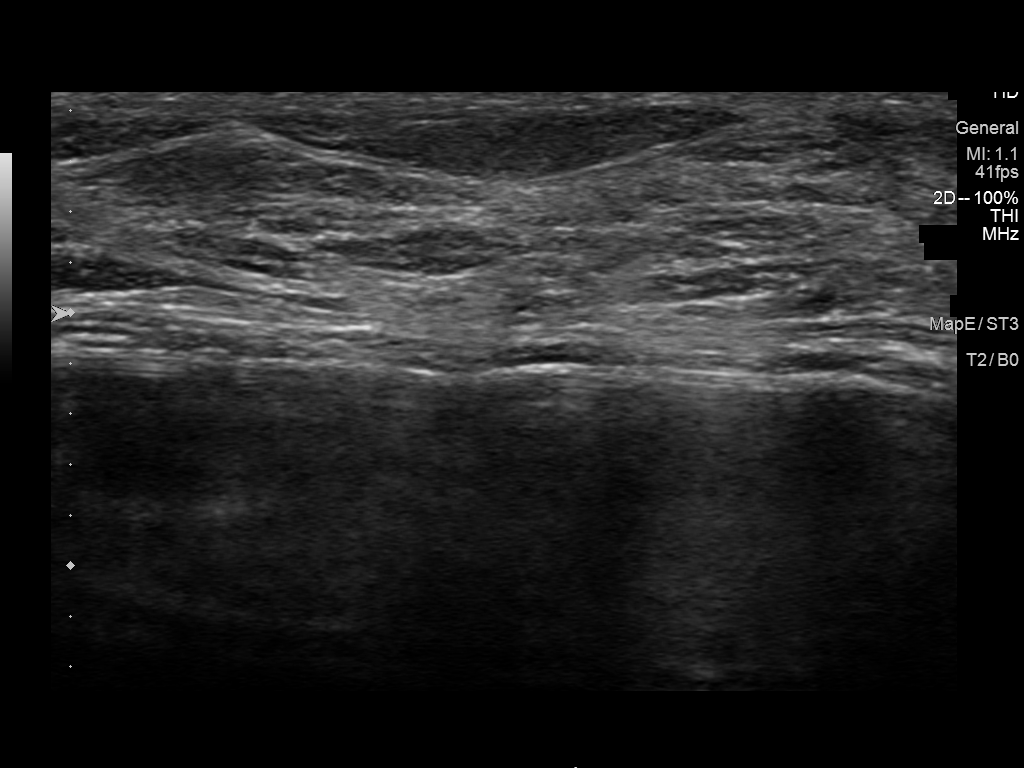
[im 3/5]
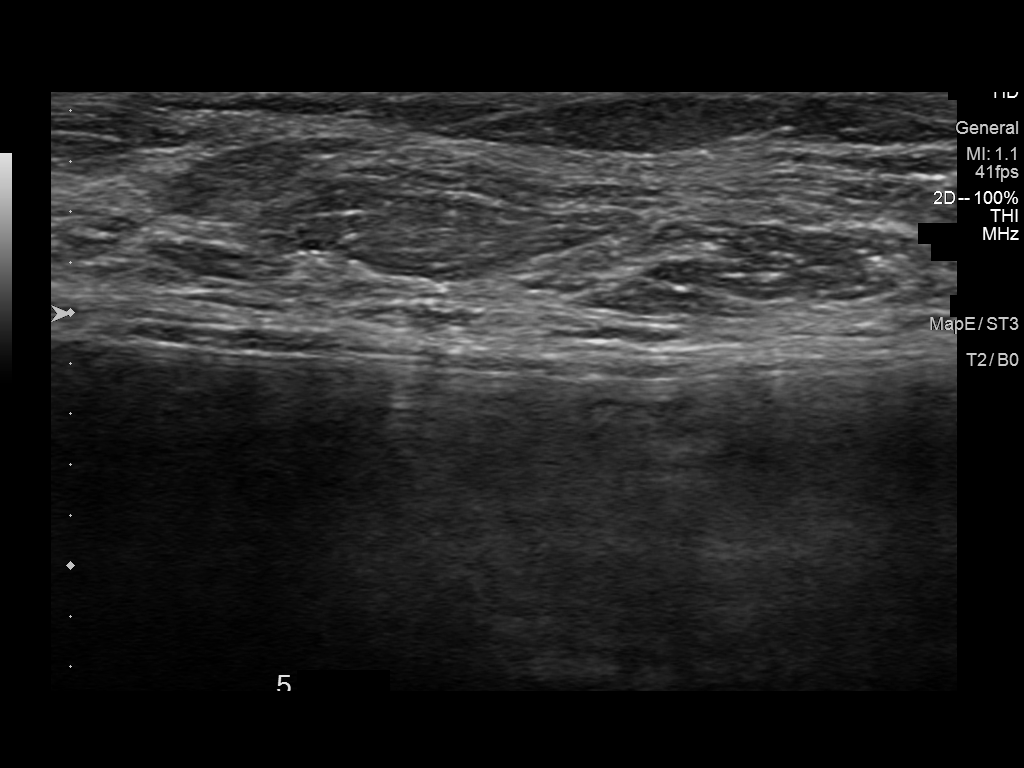
[im 4/5]
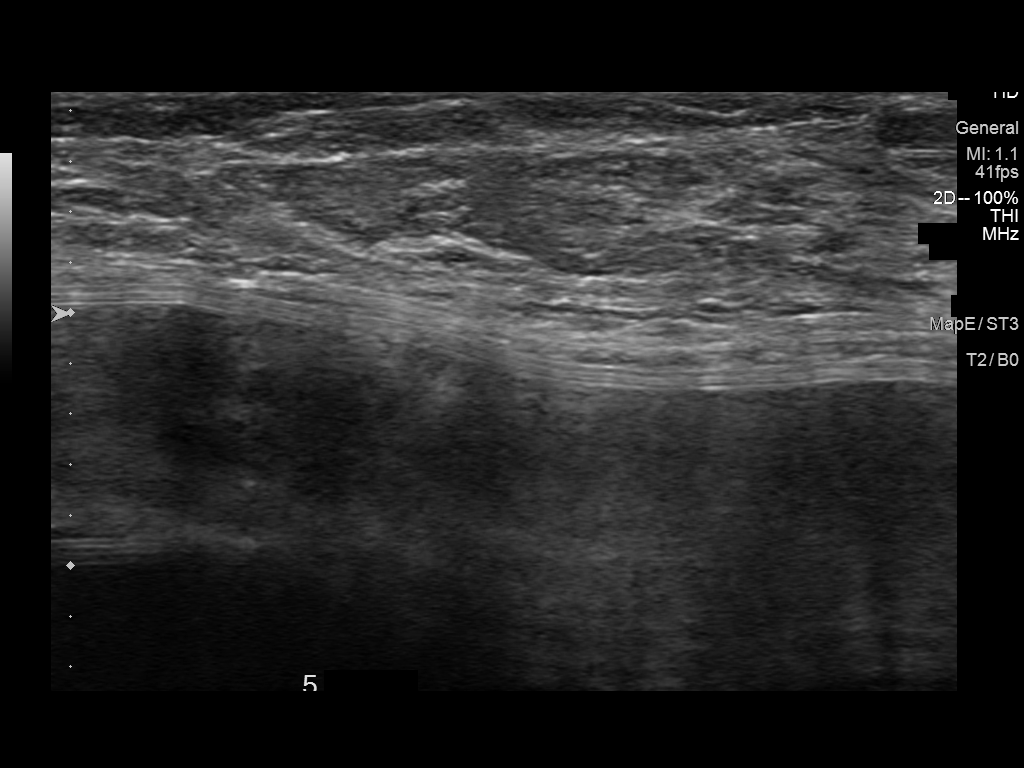
[im 5/5]
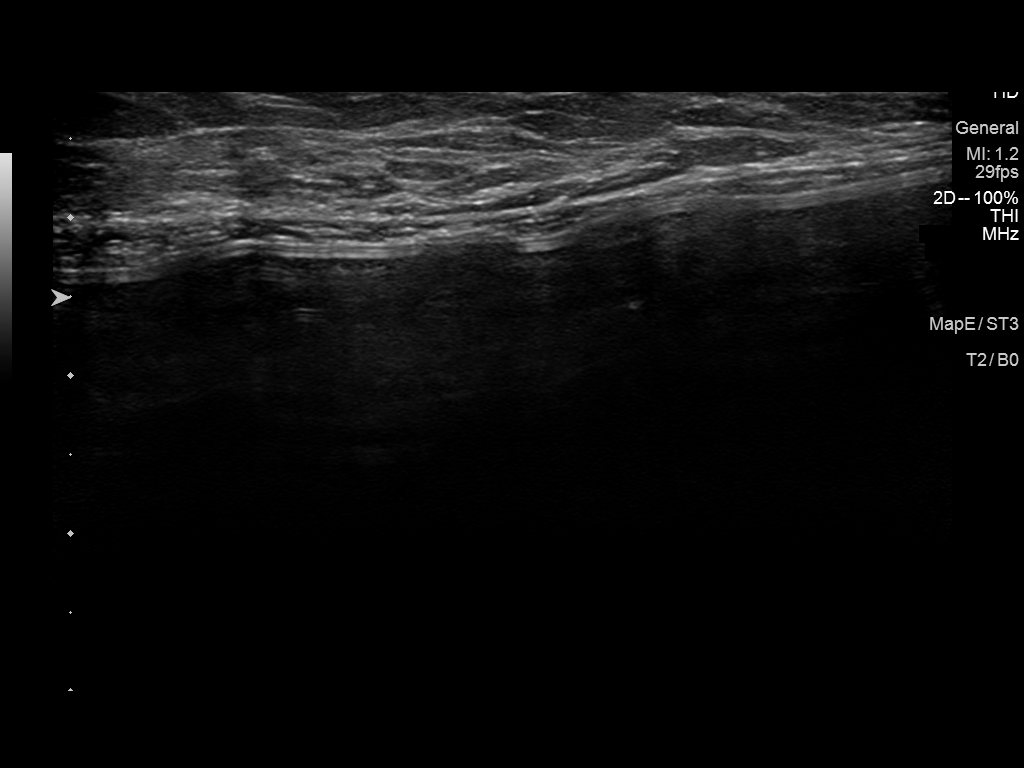

[5 of 5 positions shown; findings below may reference images not displayed]

FINDINGS: On physical exam, no suspicious mass is appreciated.

Targeted ultrasound was performed of the site of painful concern. No
suspicious cystic or solid mass is seen. A retropectoral silicone
implant is visualized without discrete sonographic evidence of
implant rupture or periimplant fluid collection. Targeted ultrasound
was performed of the site of palpable concern. No suspicious cystic
or solid mass is seen.
IMPRESSION: No sonographic evidence of malignancy at the site of painful or
palpable concern in the LEFT breast. No focal drainable fluid
collection. Any further workup of the patient's symptoms should be
based on the clinical assessment.

RECOMMENDATION:
1. Screening mammogram at age 40 unless there are persistent or
intervening clinical concerns. (Code:[id number])
2. If there is a concern about implant integrity, this would be best
evaluated with breast MRI without contrast.

I have discussed the findings and recommendations with the patient.
If applicable, a reminder letter will be sent to the patient
regarding the next appointment.

BI-RADS CATEGORY  1: Negative.
